# Patient Record
Sex: Female | Born: 1935 | Race: White | Hispanic: No | Marital: Married | Smoking: Never smoker
Health system: Southern US, Community
[De-identification: ages and names within clinical notes are randomized; demographics above are authoritative.]

## PROBLEM LIST (undated history)

## (undated) DIAGNOSIS — I639 Cerebral infarction, unspecified: Secondary | ICD-10-CM

## (undated) DIAGNOSIS — M1990 Unspecified osteoarthritis, unspecified site: Secondary | ICD-10-CM

## (undated) DIAGNOSIS — I10 Essential (primary) hypertension: Secondary | ICD-10-CM

## (undated) DIAGNOSIS — I38 Endocarditis, valve unspecified: Secondary | ICD-10-CM

## (undated) HISTORY — PX: JOINT REPLACEMENT: SHX530

## (undated) HISTORY — PX: FRACTURE SURGERY: SHX138

## (undated) HISTORY — PX: CARDIAC CATHETERIZATION: SHX172

---

## 2003-06-20 ENCOUNTER — Other Ambulatory Visit: Payer: Self-pay

## 2004-09-11 ENCOUNTER — Ambulatory Visit: Payer: Self-pay | Admitting: Internal Medicine

## 2005-10-01 ENCOUNTER — Ambulatory Visit: Payer: Self-pay | Admitting: Internal Medicine

## 2006-04-11 ENCOUNTER — Ambulatory Visit: Payer: Self-pay | Admitting: Unknown Physician Specialty

## 2006-12-04 ENCOUNTER — Ambulatory Visit: Payer: Self-pay | Admitting: Internal Medicine

## 2007-02-15 ENCOUNTER — Emergency Department: Payer: Self-pay | Admitting: Internal Medicine

## 2007-08-10 ENCOUNTER — Ambulatory Visit: Payer: Self-pay | Admitting: General Practice

## 2007-08-24 ENCOUNTER — Inpatient Hospital Stay: Payer: Self-pay | Admitting: General Practice

## 2008-01-06 ENCOUNTER — Ambulatory Visit: Payer: Self-pay | Admitting: Internal Medicine

## 2008-07-28 ENCOUNTER — Ambulatory Visit: Payer: Self-pay | Admitting: Ophthalmology

## 2008-07-28 ENCOUNTER — Ambulatory Visit: Payer: Self-pay | Admitting: Cardiology

## 2009-02-07 ENCOUNTER — Ambulatory Visit: Payer: Self-pay | Admitting: Internal Medicine

## 2010-02-23 ENCOUNTER — Ambulatory Visit: Payer: Self-pay | Admitting: Internal Medicine

## 2011-04-23 ENCOUNTER — Ambulatory Visit: Payer: Self-pay | Admitting: Internal Medicine

## 2012-04-23 ENCOUNTER — Ambulatory Visit: Payer: Self-pay | Admitting: Internal Medicine

## 2013-04-26 ENCOUNTER — Emergency Department: Payer: Self-pay | Admitting: Emergency Medicine

## 2013-04-26 LAB — CBC WITH DIFFERENTIAL/PLATELET
Basophil %: 0.1 %
Eosinophil %: 0 %
HGB: 13.5 g/dL (ref 12.0–16.0)
Lymphocyte #: 1.2 10*3/uL (ref 1.0–3.6)
Lymphocyte %: 11 %
Monocyte #: 0.6 x10 3/mm (ref 0.2–0.9)
Monocyte %: 5.6 %
Neutrophil #: 8.8 10*3/uL — ABNORMAL HIGH (ref 1.4–6.5)
Neutrophil %: 83.3 %
Platelet: 222 10*3/uL (ref 150–440)
RBC: 4.11 10*6/uL (ref 3.80–5.20)

## 2013-04-26 LAB — PROTIME-INR: INR: 1

## 2013-04-26 LAB — BASIC METABOLIC PANEL
Anion Gap: 5 — ABNORMAL LOW (ref 7–16)
BUN: 18 mg/dL (ref 7–18)
Calcium, Total: 8.9 mg/dL (ref 8.5–10.1)
EGFR (African American): 56 — ABNORMAL LOW
EGFR (Non-African Amer.): 48 — ABNORMAL LOW
Osmolality: 276 (ref 275–301)
Potassium: 3.5 mmol/L (ref 3.5–5.1)

## 2013-04-26 LAB — APTT: Activated PTT: 27.8 secs (ref 23.6–35.9)

## 2013-06-12 ENCOUNTER — Ambulatory Visit: Payer: Self-pay | Admitting: General Practice

## 2014-02-21 DIAGNOSIS — I2510 Atherosclerotic heart disease of native coronary artery without angina pectoris: Secondary | ICD-10-CM | POA: Diagnosis present

## 2014-02-21 DIAGNOSIS — I10 Essential (primary) hypertension: Secondary | ICD-10-CM | POA: Diagnosis present

## 2014-10-24 IMAGING — CR DG SHOULDER 3+V*L*
1 series · 2 of 2 positions shown · non-contrast
Comparison: 04/26/2013

CLINICAL DATA: Postreduction.

EXAM:
DG SHOULDER 3+VIEWS LEFT

[Series 1: grashey · 0.17mm/px · 2 of 2 slices shown]
[im 1/2]
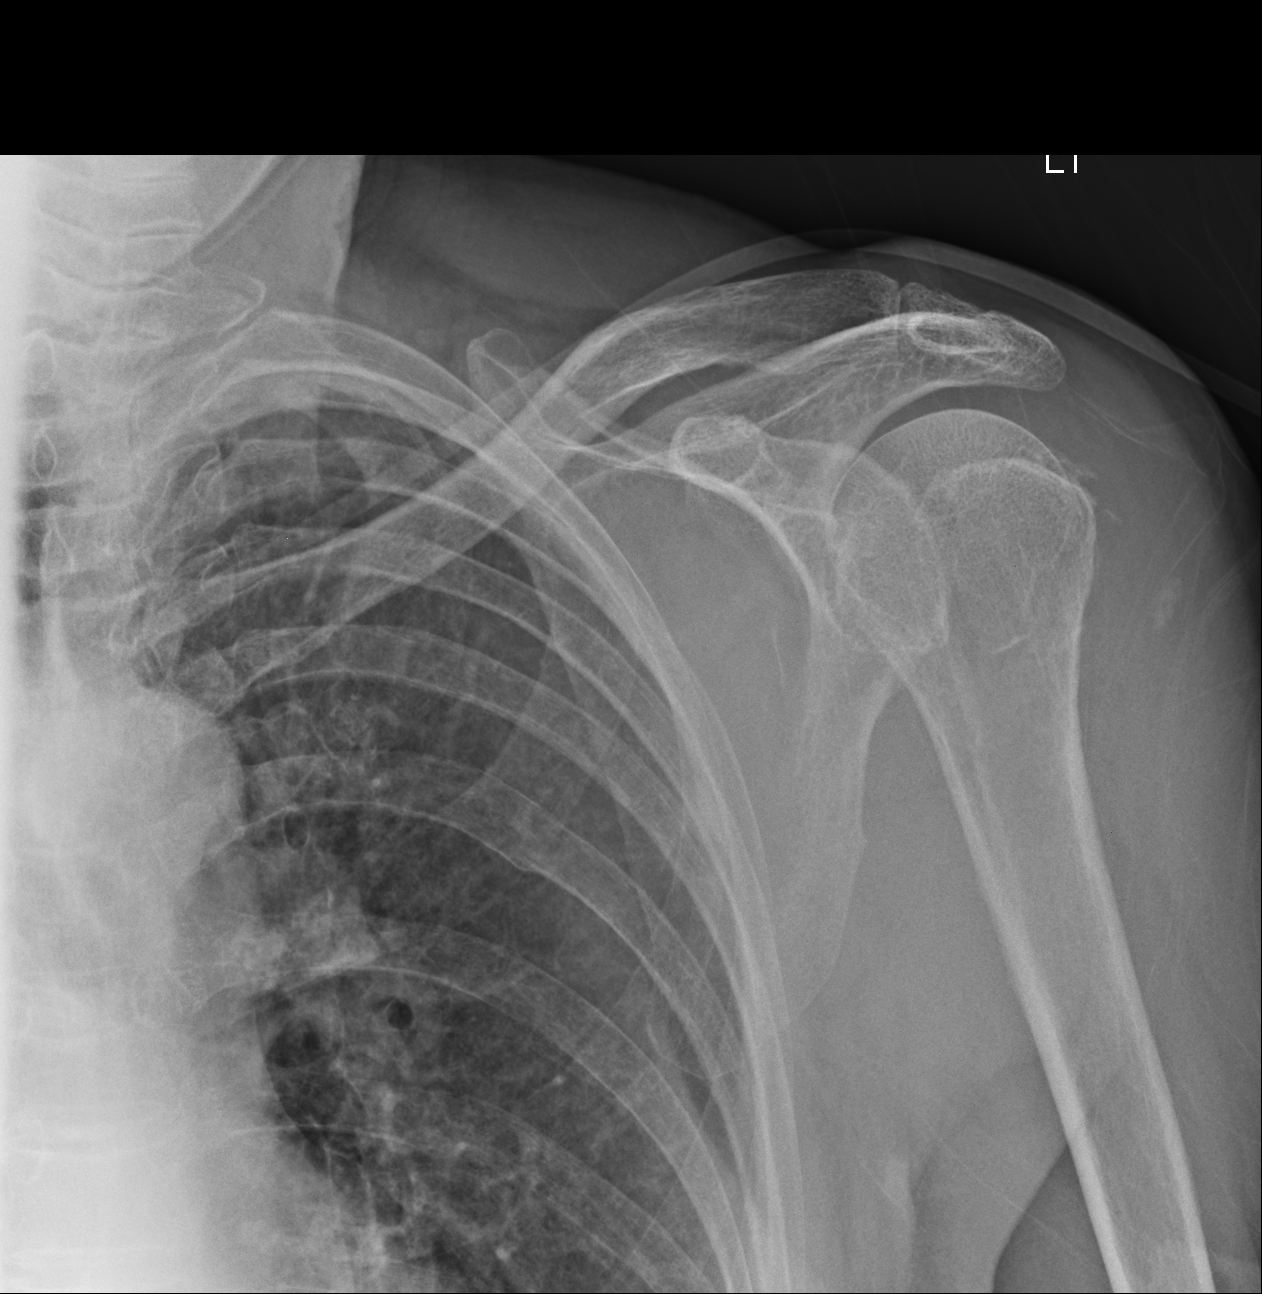
[im 2/2]
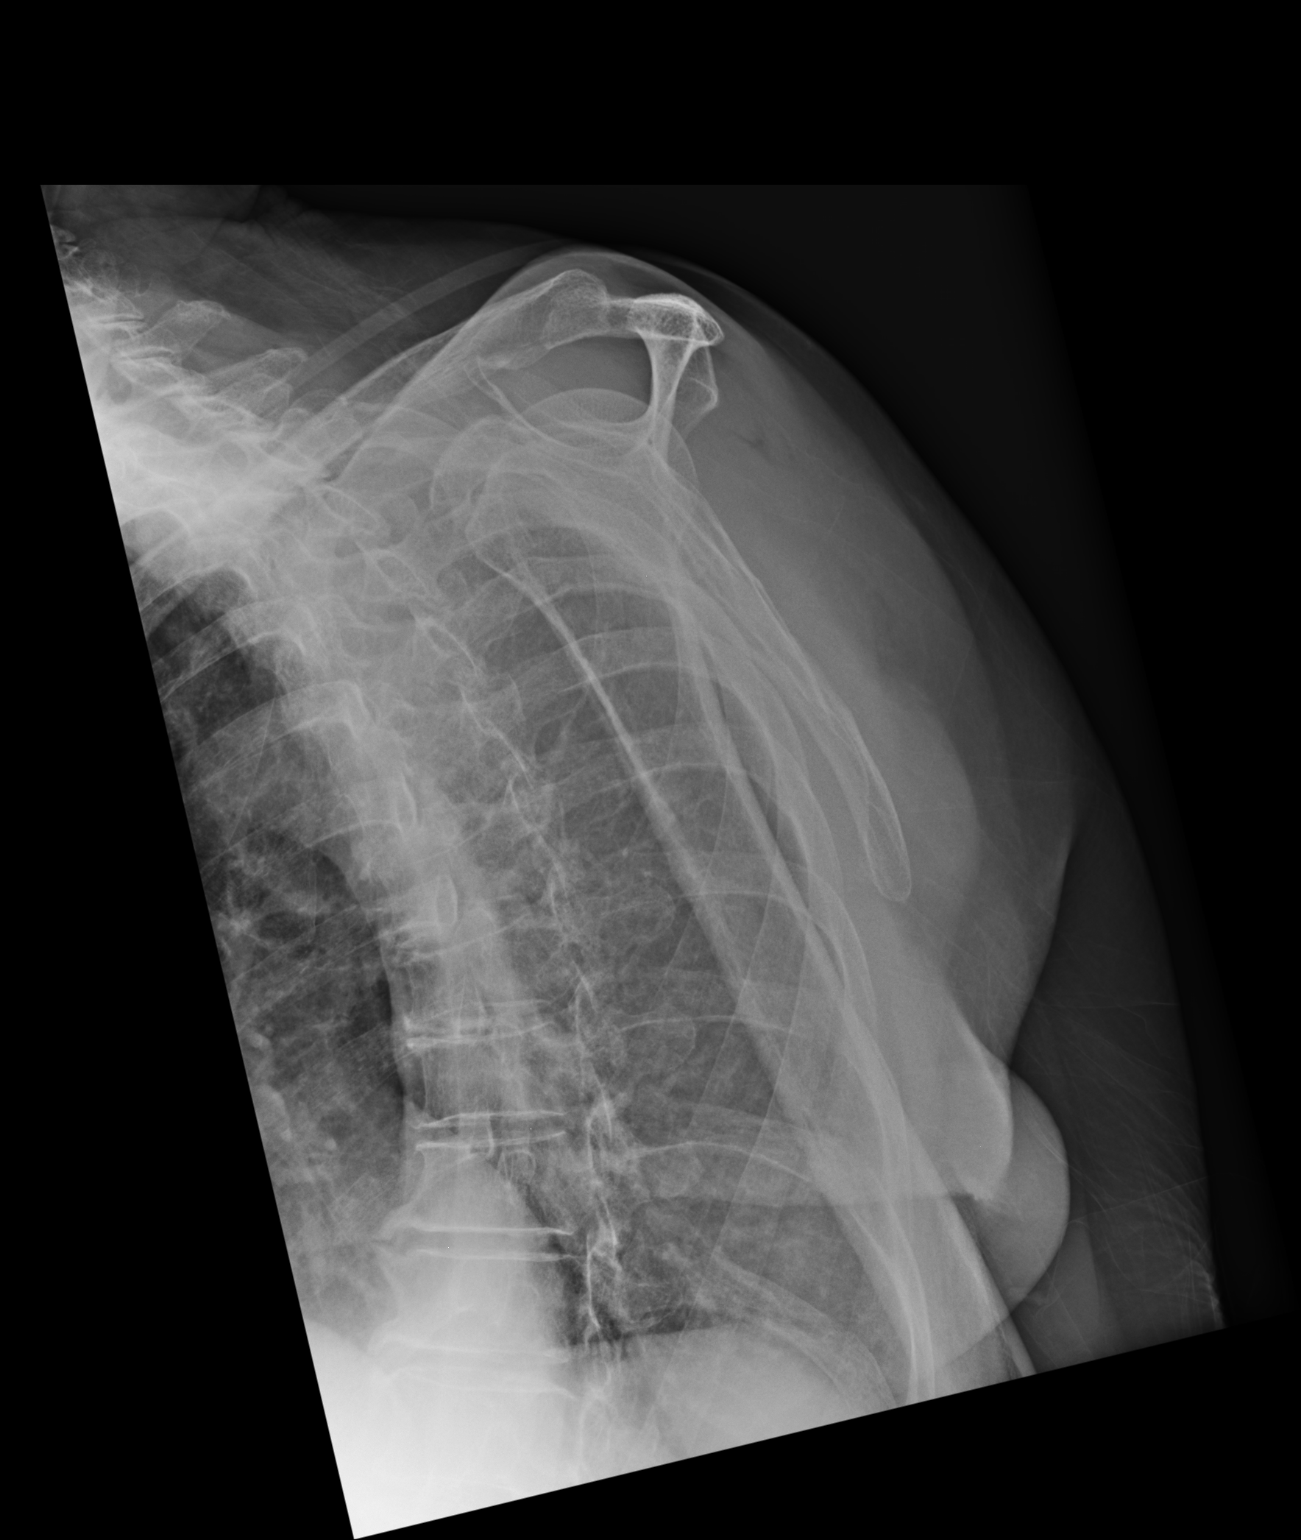

[2 of 2 positions shown; findings below may reference images not displayed]

FINDINGS: Interval reduction of the dislocated left shoulder. There appears to
be normal alignment currently. The large bone fragment at the
greater tuberosity not as well visualized on these repeat images.
IMPRESSION: Interval reduction of the dislocated left shoulder. Large bone
fragment not as well visualized.

## 2016-04-18 ENCOUNTER — Encounter: Payer: Self-pay | Admitting: *Deleted

## 2016-04-23 ENCOUNTER — Ambulatory Visit: Payer: Medicare Other | Admitting: Anesthesiology

## 2016-04-23 ENCOUNTER — Ambulatory Visit
Admission: RE | Admit: 2016-04-23 | Discharge: 2016-04-23 | Disposition: A | Discharge status: Home / Self Care | Payer: Medicare Other | Source: Ambulatory Visit | Attending: Ophthalmology | Admitting: Ophthalmology

## 2016-04-23 ENCOUNTER — Encounter
Admission: RE | Disposition: A | Discharge status: Home / Self Care | Payer: Self-pay | Source: Ambulatory Visit | Attending: Ophthalmology

## 2016-04-23 ENCOUNTER — Encounter: Payer: Self-pay | Admitting: Ophthalmology

## 2016-04-23 DIAGNOSIS — H2512 Age-related nuclear cataract, left eye: Secondary | ICD-10-CM | POA: Insufficient documentation

## 2016-04-23 HISTORY — DX: Unspecified osteoarthritis, unspecified site: M19.90

## 2016-04-23 HISTORY — DX: Endocarditis, valve unspecified: I38

## 2016-04-23 HISTORY — PX: CATARACT EXTRACTION W/PHACO: SHX586

## 2016-04-23 HISTORY — DX: Cerebral infarction, unspecified: I63.9

## 2016-04-23 HISTORY — DX: Essential (primary) hypertension: I10

## 2016-04-23 SURGERY — PHACOEMULSIFICATION, CATARACT, WITH IOL INSERTION
Anesthesia: Topical | Site: Eye | Laterality: Left | Wound class: Clean

## 2016-04-23 MED ORDER — POVIDONE-IODINE 5 % OP SOLN
OPHTHALMIC | Status: DC | PRN
  Administered 2016-04-23: 1 via OPHTHALMIC

## 2016-04-23 MED ORDER — ARMC OPHTHALMIC DILATING DROPS
OPHTHALMIC | Status: AC
  Administered 2016-04-23: 1 via OPHTHALMIC
  Filled 2016-04-23: qty 0.4

## 2016-04-23 MED ORDER — LIDOCAINE HCL (PF) 4 % IJ SOLN
INTRAMUSCULAR | Status: DC | PRN
  Administered 2016-04-23: 4 mL via OPHTHALMIC

## 2016-04-23 MED ORDER — NA CHONDROIT SULF-NA HYALURON 40-17 MG/ML IO SOLN
INTRAOCULAR | Status: AC
  Filled 2016-04-23: qty 1

## 2016-04-23 MED ORDER — EPINEPHRINE PF 1 MG/ML IJ SOLN
INTRAMUSCULAR | Status: AC
  Filled 2016-04-23: qty 2

## 2016-04-23 MED ORDER — MOXIFLOXACIN HCL 0.5 % OP SOLN
1.0000 [drp] | OPHTHALMIC | Status: AC
  Administered 2016-04-23 (×3): 1 [drp] via OPHTHALMIC

## 2016-04-23 MED ORDER — LIDOCAINE HCL (PF) 4 % IJ SOLN
INTRAMUSCULAR | Status: AC
  Filled 2016-04-23: qty 5

## 2016-04-23 MED ORDER — MOXIFLOXACIN HCL 0.5 % OP SOLN
OPHTHALMIC | Status: DC | PRN
  Administered 2016-04-23: 1 [drp] via OPHTHALMIC

## 2016-04-23 MED ORDER — POVIDONE-IODINE 5 % OP SOLN
OPHTHALMIC | Status: AC
Start: 2016-04-23 — End: 2016-04-23
  Filled 2016-04-23: qty 30

## 2016-04-23 MED ORDER — EPINEPHRINE PF 1 MG/ML IJ SOLN
INTRAMUSCULAR | Status: DC | PRN
  Administered 2016-04-23: 1 mL via OPHTHALMIC

## 2016-04-23 MED ORDER — CARBACHOL 0.01 % IO SOLN
INTRAOCULAR | Status: DC | PRN
  Administered 2016-04-23: 0.5 mL via INTRAOCULAR

## 2016-04-23 MED ORDER — MOXIFLOXACIN HCL 0.5 % OP SOLN
OPHTHALMIC | Status: AC
Start: 2016-04-23 — End: 2016-04-23
  Administered 2016-04-23: 1 [drp] via OPHTHALMIC
  Filled 2016-04-23: qty 3

## 2016-04-23 MED ORDER — NA CHONDROIT SULF-NA HYALURON 40-17 MG/ML IO SOLN
INTRAOCULAR | Status: DC | PRN
  Administered 2016-04-23: 1 mL via INTRAOCULAR

## 2016-04-23 MED ORDER — SODIUM CHLORIDE 0.9 % IV SOLN
INTRAVENOUS | Status: DC
  Administered 2016-04-23: 13:00:00 via INTRAVENOUS

## 2016-04-23 MED ORDER — MIDAZOLAM HCL 2 MG/2ML IJ SOLN
INTRAMUSCULAR | Status: DC | PRN
  Administered 2016-04-23: 1 mg via INTRAVENOUS

## 2016-04-23 MED ORDER — ARMC OPHTHALMIC DILATING DROPS
1.0000 "application " | OPHTHALMIC | Status: AC
  Administered 2016-04-23 (×3): 1 via OPHTHALMIC

## 2016-04-23 SURGICAL SUPPLY — 21 items
CANNULA ANT/CHMB 27GA (MISCELLANEOUS) ×3 IMPLANT
CUP MEDICINE 2OZ PLAST GRAD ST (MISCELLANEOUS) ×3 IMPLANT
GLOVE BIO SURGEON STRL SZ8 (GLOVE) ×3 IMPLANT
GLOVE BIOGEL M 6.5 STRL (GLOVE) ×3 IMPLANT
GLOVE SURG LX 8.0 MICRO (GLOVE) ×2
GLOVE SURG LX STRL 8.0 MICRO (GLOVE) ×1 IMPLANT
GOWN STRL REUS W/ TWL LRG LVL3 (GOWN DISPOSABLE) ×2 IMPLANT
GOWN STRL REUS W/TWL LRG LVL3 (GOWN DISPOSABLE) ×4
LENS IOL TECNIS ITEC 23.0 (Intraocular Lens) ×3 IMPLANT
PACK CATARACT (MISCELLANEOUS) ×3 IMPLANT
PACK CATARACT BRASINGTON LX (MISCELLANEOUS) ×3 IMPLANT
PACK EYE AFTER SURG (MISCELLANEOUS) ×3 IMPLANT
SOL BSS BAG (MISCELLANEOUS) ×3
SOL PREP PVP 2OZ (MISCELLANEOUS) ×3
SOLUTION BSS BAG (MISCELLANEOUS) ×1 IMPLANT
SOLUTION PREP PVP 2OZ (MISCELLANEOUS) ×1 IMPLANT
SYR 3ML LL SCALE MARK (SYRINGE) ×3 IMPLANT
SYR 5ML LL (SYRINGE) ×3 IMPLANT
SYR TB 1ML 27GX1/2 LL (SYRINGE) ×3 IMPLANT
WATER STERILE IRR 250ML POUR (IV SOLUTION) ×3 IMPLANT
WIPE NON LINTING 3.25X3.25 (MISCELLANEOUS) ×3 IMPLANT

## 2016-04-23 NOTE — Transfer of Care (Signed)
Immediate Anesthesia Transfer of Care Note  Patient: Caitlyn HamperFrances Burns  Procedure(s) Performed: Procedure(s) with comments: CATARACT EXTRACTION PHACO AND INTRAOCULAR LENS PLACEMENT (IOC) (Left) - US 59.9 AP% 20.9 CDE 12.48 FLUID PACK LOT # [id number]  Patient Location: PACU and Short Stay  Anesthesia Type:MAC  Level of Consciousness: awake, alert  and oriented  Airway & Oxygen Therapy: Patient Spontanous Breathing  Post-op Assessment: Report given to RN and Post -op Vital signs reviewed and stable  Post vital signs: Reviewed and stable  Last Vitals:  Vitals:   04/23/16 1210 04/23/16 1351  BP: (!) 135/51 (!) 120/43  Pulse: 63 (!) 59  Resp:  16  Temp: 36.5 C (!) 36 C    Last Pain:  Vitals:   04/23/16 1351  TempSrc: Oral         Complications: No apparent anesthesia complications

## 2016-04-23 NOTE — Op Note (Signed)
PREOPERATIVE DIAGNOSIS:  Nuclear sclerotic cataract of the left eye.   POSTOPERATIVE DIAGNOSIS:  Nuclear sclerotic cataract of the left eye.   OPERATIVE PROCEDURE: Procedure(s): CATARACT EXTRACTION PHACO AND INTRAOCULAR LENS PLACEMENT (IOC)   SURGEON:  Galen ManilaWilliam Rafaelita Foister, MD.   ANESTHESIA:  Anesthesiologist: Yevette EdwardsJames G Adams, MD CRNA: Omer JackJanice Weatherly, CRNA  1.      Managed anesthesia care. 2.     0.131ml of Shugarcaine was instilled following the paracentesis   COMPLICATIONS:  None.   TECHNIQUE:   Stop and chop   DESCRIPTION OF PROCEDURE:  The patient was examined and consented in the preoperative holding area where the aforementioned topical anesthesia was applied to the left eye and then brought back to the Operating Room where the left eye was prepped and draped in the usual sterile ophthalmic fashion and a lid speculum was placed. A paracentesis was created with the side port blade and the anterior chamber was filled with viscoelastic. A near clear corneal incision was performed with the steel keratome. A continuous curvilinear capsulorrhexis was performed with a cystotome followed by the capsulorrhexis forceps. Hydrodissection and hydrodelineation were carried out with BSS on a blunt cannula. The lens was removed in a stop and chop  technique and the remaining cortical material was removed with the irrigation-aspiration handpiece. The capsular bag was inflated with viscoelastic and the Technis [id number] lens was placed in the capsular bag without complication. The remaining viscoelastic was removed from the eye with the irrigation-aspiration handpiece. The wounds were hydrated. The anterior chamber was flushed with Miostat and the eye was inflated to physiologic pressure. 0.691ml Vigamox was placed in the anterior chamber. The wounds were found to be water tight. The eye was dressed with Vigamox. The patient was given protective glasses to wear throughout the day and a shield with which to sleep  tonight. The patient was also given drops with which to begin a drop regimen today and will follow-up with me in one day.  Implant Name Type Inv. Item Serial No. Manufacturer Lot No. LRB No. Used  LENS IOL DIOP 23.0 - [id number] Intraocular Lens LENS IOL DIOP 23.0 [id number] AMO   Left 1    Procedure(s) with comments: CATARACT EXTRACTION PHACO AND INTRAOCULAR LENS PLACEMENT (IOC) (Left) - US 59.9 AP% 20.9 CDE 12.48 FLUID PACK LOT # [id number] H  Electronically signed: Mitchell Iwanicki LOUIS 04/23/2016 1:47 PM

## 2016-04-23 NOTE — Anesthesia Postprocedure Evaluation (Signed)
Anesthesia Post Note  Patient: Caitlyn Burns  Procedure(s) Performed: Procedure(s) (LRB): CATARACT EXTRACTION PHACO AND INTRAOCULAR LENS PLACEMENT (IOC) (Left)  Patient location during evaluation: Short Stay Level of consciousness: awake and alert and oriented Pain management: pain level controlled Vital Signs Assessment: post-procedure vital signs reviewed and stable Respiratory status: spontaneous breathing Cardiovascular status: stable Postop Assessment: no headache Anesthetic complications: no    Last Vitals:  Vitals:   04/23/16 1210 04/23/16 1351  BP: (!) 135/51 (!) 120/43  Pulse: 63 (!) 59  Resp:  16  Temp: 36.5 C (!) 36 C    Last Pain:  Vitals:   04/23/16 1351  TempSrc: Oral                 Caitlyn Burns,  Caitlyn Burns

## 2016-04-23 NOTE — Discharge Instructions (Signed)
Eye Surgery Discharge Instructions  Expect mild scratchy sensation or mild soreness. DO NOT RUB YOUR EYE!  The day of surgery:  Minimal physical activity, but bed rest is not required  No reading, computer work, or close hand work  No bending, lifting, or straining.  May watch TV  For 24 hours:  No driving, legal decisions, or alcoholic beverages  Safety precautions  Eat anything you prefer: It is better to start with liquids, then soup then solid foods.  _____ Eye patch should be worn until postoperative exam tomorrow.  ____ Solar shield eyeglasses should be worn for comfort in the sunlight/patch while sleeping  Resume all regular medications including aspirin or Coumadin if these were discontinued prior to surgery. You may shower, bathe, shave, or wash your hair. Tylenol may be taken for mild discomfort.  Call your doctor if you experience significant pain, nausea, or vomiting, fever > 101 or other signs of infection. [phone] or [phone] Specific instructions:  Follow-up Information    PORFILIO,WILLIAM LOUIS, MD Follow up on 04/24/2016.   Specialty:  Ophthalmology Why:  1:10 Contact information: [address] [place] [address] [phone]

## 2016-04-23 NOTE — H&P (Signed)
All labs reviewed. Abnormal studies sent to patients PCP when indicated.  Previous H&P reviewed, patient examined, there are NO CHANGES.  Caitlyn Burns LOUIS11/14/20171:19 PM

## 2016-04-25 NOTE — Anesthesia Preprocedure Evaluation (Signed)
Anesthesia Evaluation  Patient identified by MRN, date of birth, ID band Patient awake    Reviewed: Allergy & Precautions, H&P , NPO status , Patient's Chart, lab work & pertinent test results, reviewed documented beta blocker date and time   Airway Mallampati: II  TM Distance: >3 FB Neck ROM: full    Dental no notable dental hx. (+) Teeth Intact   Pulmonary neg pulmonary ROS,    Pulmonary exam normal breath sounds clear to auscultation       Cardiovascular Exercise Tolerance: Good hypertension, negative cardio ROS   Rhythm:regular Rate:Normal     Neuro/Psych CVA negative neurological ROS  negative psych ROS   GI/Hepatic negative GI ROS, Neg liver ROS,   Endo/Other  negative endocrine ROSdiabetes  Renal/GU      Musculoskeletal   Abdominal   Peds  Hematology negative hematology ROS (+)   Anesthesia Other Findings   Reproductive/Obstetrics negative OB ROS                             Anesthesia Physical Anesthesia Plan  ASA: III  Anesthesia Plan: MAC   Post-op Pain Management:    Induction:   Airway Management Planned:   Additional Equipment:   Intra-op Plan:   Post-operative Plan:   Informed Consent: I have reviewed the patients History and Physical, chart, labs and discussed the procedure including the risks, benefits and alternatives for the proposed anesthesia with the patient or authorized representative who has indicated his/her understanding and acceptance.     Plan Discussed with: CRNA  Anesthesia Plan Comments:         Anesthesia Quick Evaluation

## 2019-12-16 DIAGNOSIS — N1831 Chronic kidney disease, stage 3a: Secondary | ICD-10-CM | POA: Insufficient documentation

## 2020-12-19 ENCOUNTER — Other Ambulatory Visit: Payer: Self-pay | Admitting: Internal Medicine

## 2020-12-19 DIAGNOSIS — R413 Other amnesia: Secondary | ICD-10-CM

## 2020-12-20 ENCOUNTER — Other Ambulatory Visit: Payer: Self-pay

## 2020-12-20 ENCOUNTER — Ambulatory Visit
Admission: RE | Admit: 2020-12-20 | Discharge: 2020-12-20 | Disposition: A | Discharge status: Home / Self Care | Payer: Medicare Other | Source: Ambulatory Visit | Attending: Internal Medicine | Admitting: Internal Medicine

## 2020-12-20 DIAGNOSIS — R413 Other amnesia: Secondary | ICD-10-CM

## 2021-03-12 ENCOUNTER — Other Ambulatory Visit: Payer: Self-pay | Admitting: Neurology

## 2021-03-12 ENCOUNTER — Other Ambulatory Visit (HOSPITAL_COMMUNITY): Payer: Self-pay | Admitting: Neurology

## 2021-03-12 DIAGNOSIS — R413 Other amnesia: Secondary | ICD-10-CM

## 2021-03-21 ENCOUNTER — Ambulatory Visit: Admission: RE | Admit: 2021-03-21 | Payer: Medicare Other | Source: Ambulatory Visit

## 2021-04-03 ENCOUNTER — Ambulatory Visit
Admission: RE | Admit: 2021-04-03 | Discharge: 2021-04-03 | Disposition: A | Discharge status: Home / Self Care | Payer: Medicare Other | Source: Ambulatory Visit | Attending: Neurology | Admitting: Neurology

## 2021-04-03 ENCOUNTER — Other Ambulatory Visit: Payer: Self-pay

## 2021-04-03 DIAGNOSIS — R413 Other amnesia: Secondary | ICD-10-CM | POA: Diagnosis not present

## 2021-06-21 DIAGNOSIS — F03B0 Unspecified dementia, moderate, without behavioral disturbance, psychotic disturbance, mood disturbance, and anxiety: Secondary | ICD-10-CM | POA: Diagnosis present

## 2021-06-21 DIAGNOSIS — F03C0 Unspecified dementia, severe, without behavioral disturbance, psychotic disturbance, mood disturbance, and anxiety: Secondary | ICD-10-CM | POA: Diagnosis present

## 2021-06-21 DIAGNOSIS — F0390 Unspecified dementia without behavioral disturbance: Secondary | ICD-10-CM | POA: Diagnosis present

## 2022-10-01 IMAGING — MR MR HEAD W/O CM
12 series · 43 of 48 positions shown · non-contrast
Comparison: Head CT 12/20/2020. Report from brain MRI 07/05/2003
(images unavailable).

CLINICAL DATA: Memory loss. Additional history provided by scanning
technologist: Memory loss and forgetfulness for 2-3 months.

EXAM:
MRI HEAD WITHOUT CONTRAST
TECHNIQUE: Multiplanar, multiecho pulse sequences of the brain and surrounding
structures were obtained without intravenous contrast.

[Series 5: ax dwi_tracew · axial · 3.0mm · 0.65mm/px · z∈[-111,+47]mm · 3 of 50 slices shown]
[im 1/50]
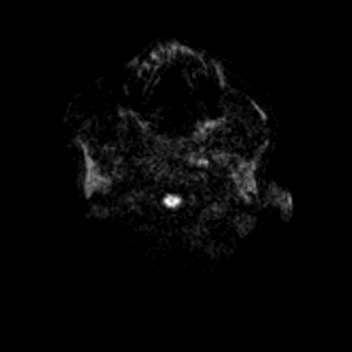
[im 25/50]
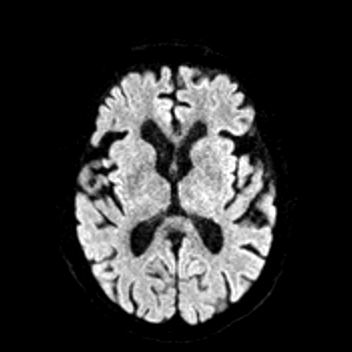
[im 50/50]
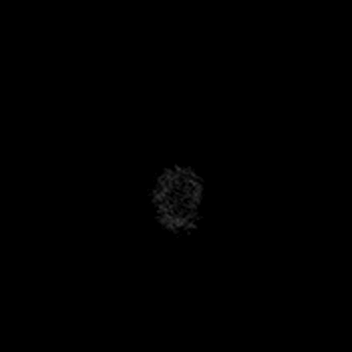

[Series 6: ax dwi_adc · axial · 3.0mm · 0.65mm/px · z∈[-111,+47]mm · 4 of 50 slices shown]
[im 1/50]
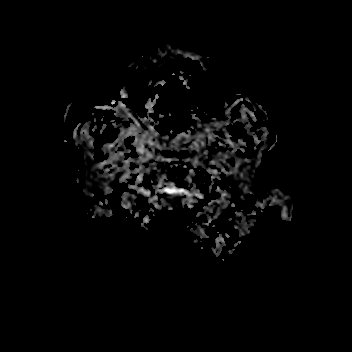
[im 17/50]
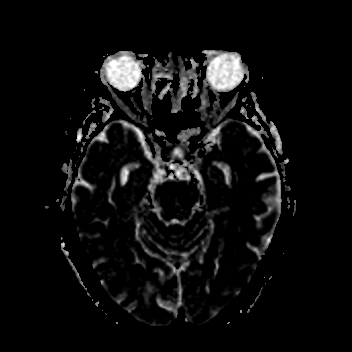
[im 33/50]
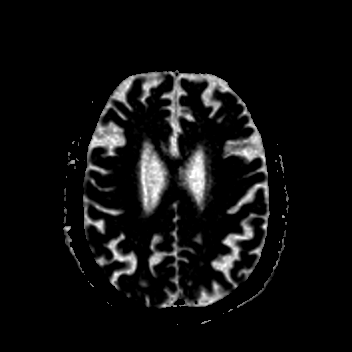
[im 50/50]
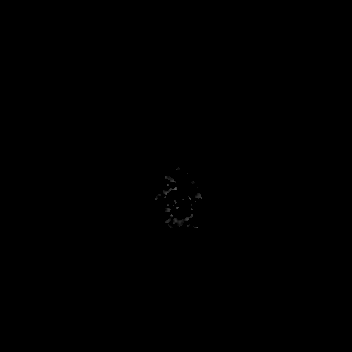

[Series 7: cor dwi_tracew · coronal · 5.0mm · 0.60mm/px · 3 of 40 slices shown]
[im 1/40]
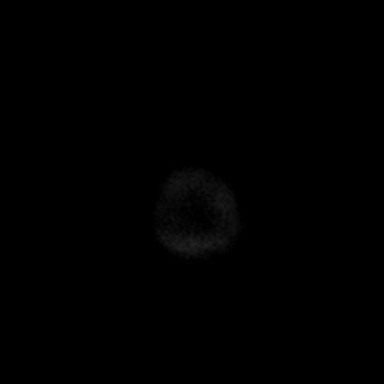
[im 20/40]
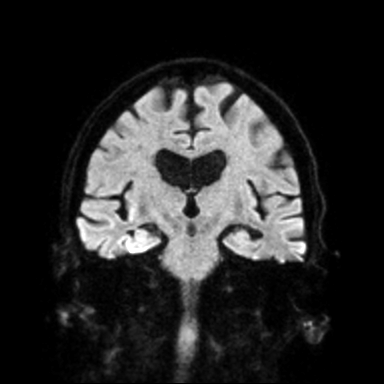
[im 40/40]
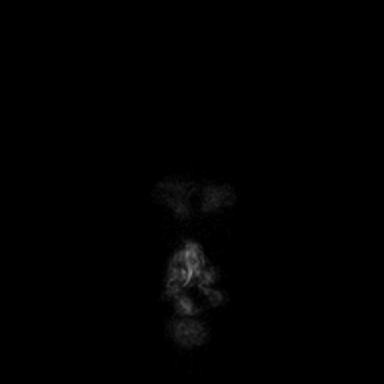

[Series 8: cor dwi_adc · coronal · 5.0mm · 0.60mm/px · 3 of 39 slices shown]
[im 1/39]
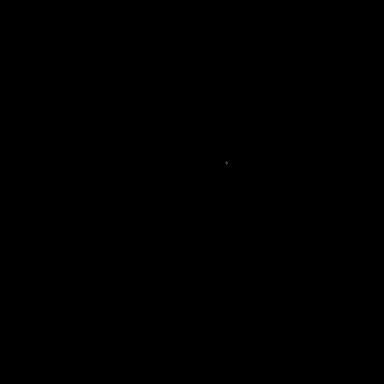
[im 20/39]
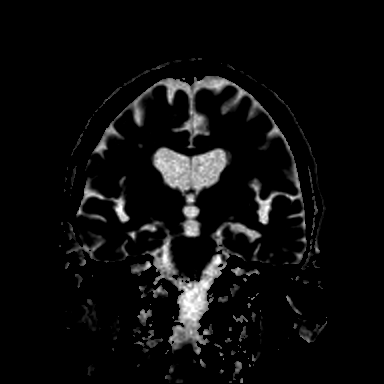
[im 39/39]
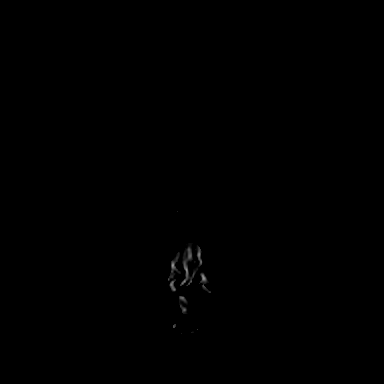

[Series 9: T1 · sagittal · 5.0mm · 0.62mm/px · 2 of 23 slices shown (1 of 2)]
[im 1/23]
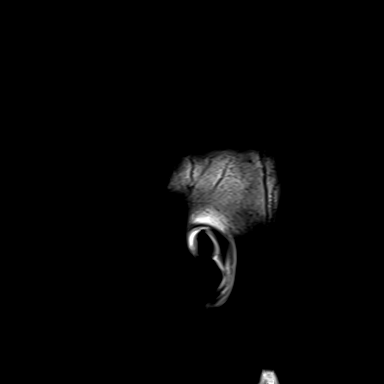
[im 23/23]
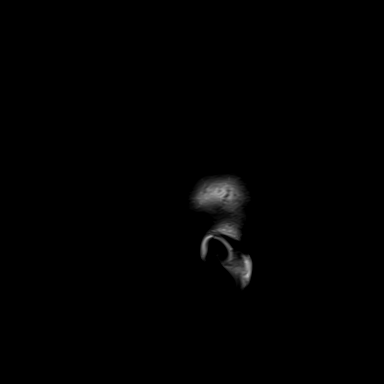

[Series 10: T2 · axial · 5.0mm · 0.53mm/px · z∈[-113,+46]mm · 2 of 28 slices shown (1 of 2)]
[im 1/28]
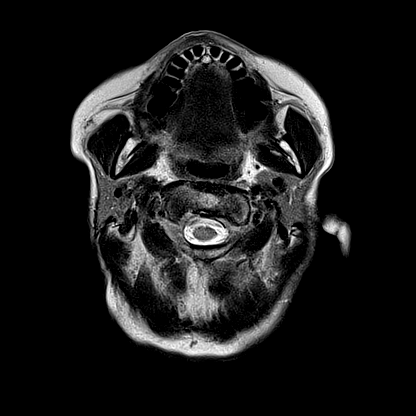
[im 28/28]
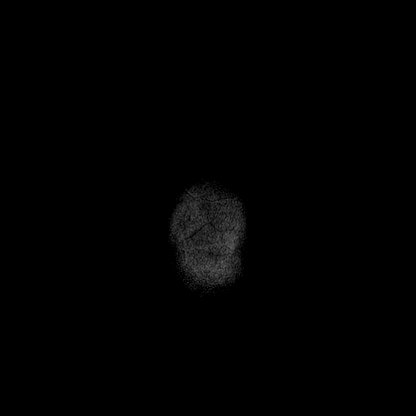

[Series 11: mag_images · axial · 3.0mm · 0.90mm/px · z∈[-119,+55]mm · 4 of 60 slices shown]
[im 1/60]
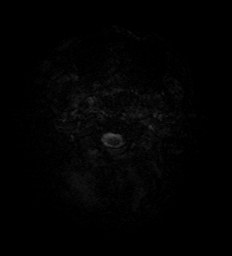
[im 20/60]
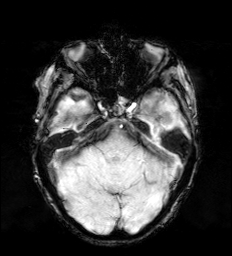
[im 40/60]
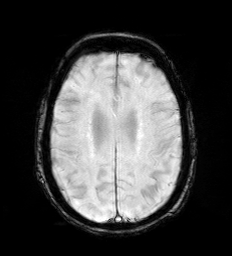
[im 60/60]
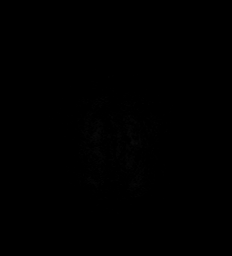

[Series 12: pha_images · axial · 3.0mm · 0.90mm/px · z∈[-119,+55]mm · 4 of 60 slices shown]
[im 1/60]
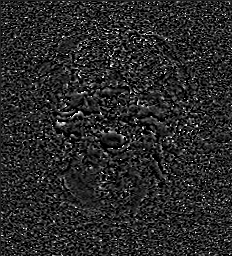
[im 20/60]
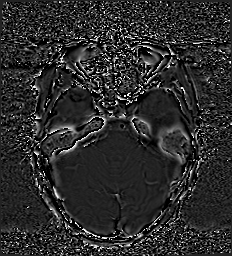
[im 40/60]
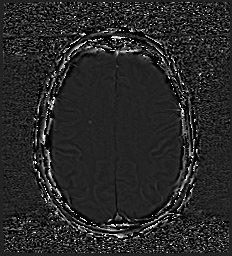
[im 60/60]
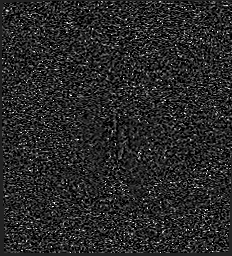

[Series 13: swi_images · axial · 3.0mm · 0.90mm/px · z∈[-119,+55]mm · 4 of 60 slices shown]
[im 1/60]
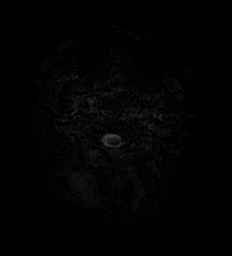
[im 20/60]
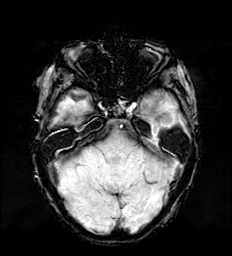
[im 40/60]
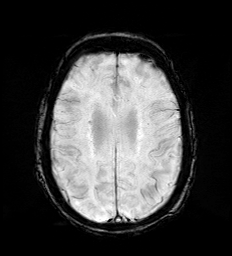
[im 60/60]
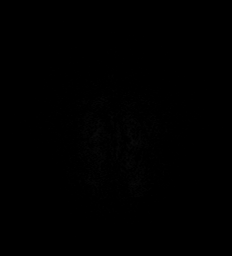

[Series 15: FLAIR · axial · 3.0mm · 0.53mm/px · z∈[-113,+46]mm · 4 of 55 slices shown]
[im 1/55]
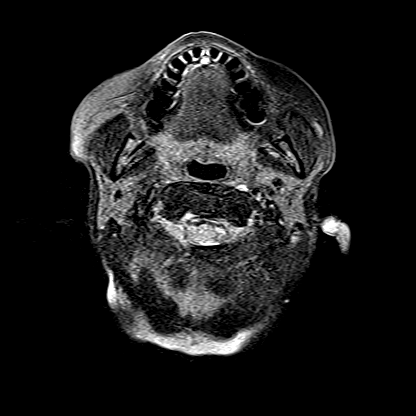
[im 19/55]
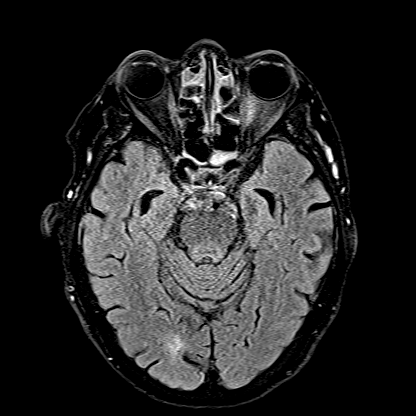
[im 37/55]
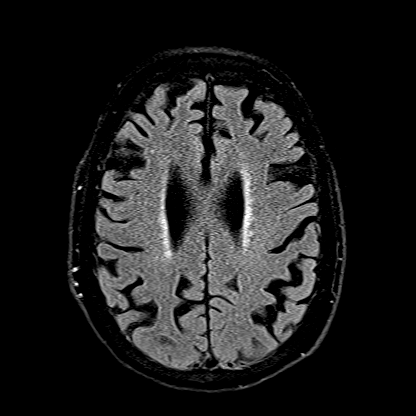
[im 55/55]
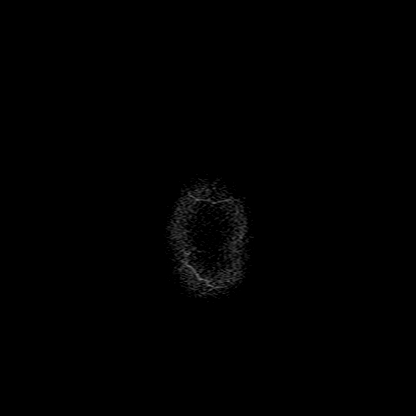

[Series 16: T1 · axial · 1.0mm · 0.98mm/px · z∈[-117,+54]mm · 8 of 176 slices shown (2 of 2)]
[im 1/176]
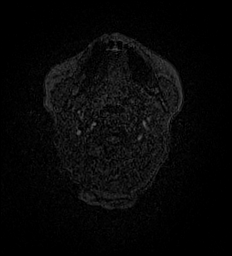
[im 30/176]
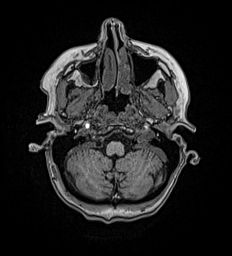
[im 59/176]
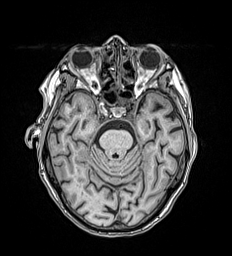
[im 73/176]
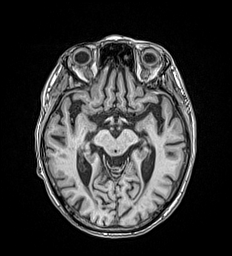
[im 103/176]
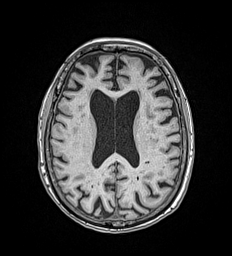
[im 117/176]
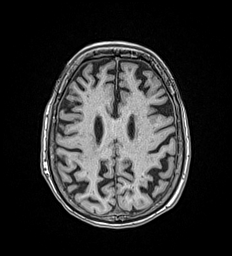
[im 146/176]
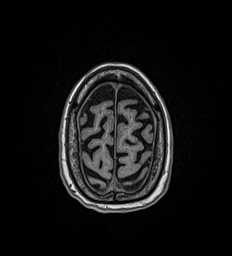
[im 176/176]
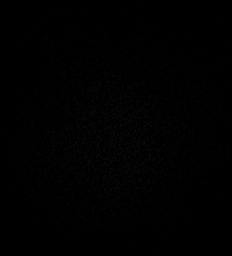

[Series 17: T2 · coronal · 5.0mm · 0.57mm/px · 2 of 29 slices shown (2 of 2)]
[im 1/29]
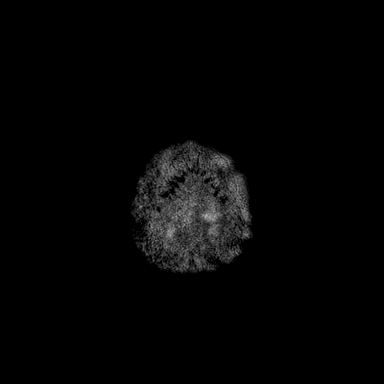
[im 29/29]
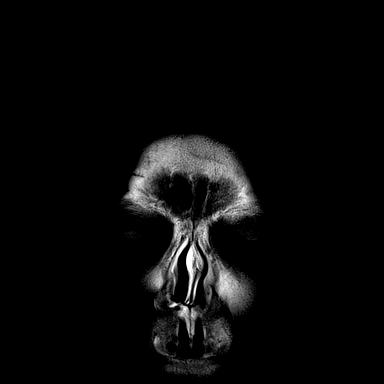

[43 of 48 positions shown; findings below may reference images not displayed]

FINDINGS: Brain:

Moderate generalized cerebral atrophy. Comparatively mild cerebellar
atrophy.

Chronic lacunar infarct within the right centrum semiovale/corona
radiata.

Small foci of T2 hyperintensity within the periatrial white matter
bilaterally, which may reflect a perivascular spaces and/or chronic
lacunar infarcts.

Background mild for age multifocal T2 FLAIR hyperintense signal
abnormality within the cerebral white matter, nonspecific but
compatible chronic small vessel ischemic disease.

Chronic microhemorrhages within the right frontal lobe
periventricular white matter and within the left thalamus.

There is no acute infarct.

No evidence of an intracranial mass.

No extra-axial fluid collection.

No midline shift.

Vascular: Maintained flow voids within the proximal large arterial
vessels.

Skull and upper cervical spine: No focal suspicious marrow lesion.

Sinuses/Orbits: Visualized orbits show no acute finding.
Mild-to-moderate partial opacification of the bilateral ethmoid air
cells secondary to the presence of mucosal thickening and fluid.
Fluid levels and background mild mucosal thickening within the
bilateral sphenoid sinuses. Mild mucosal thickening within the
bilateral maxillary sinuses. Superimposed small-volume frothy
secretions within the right maxillary sinus.

Other: Trace fluid within the bilateral mastoid air cells.
IMPRESSION: No evidence of acute intracranial abnormality.

Chronic lacunar infarct within the right centrum semiovale/corona
radiata.

Prominent perivascular spaces and/or chronic lacunar infarcts within
the bilateral periatrial white matter.

Background mild-for-age cerebral white matter chronic small vessel
ischemic disease.

Moderate generalized cerebral atrophy. Comparatively mild cerebellar
atrophy.

Paranasal sinus disease, as described. Correlate for acute
sinusitis.

Trace fluid within the bilateral mastoid air cells.

## 2023-01-25 ENCOUNTER — Emergency Department: Payer: Medicare Other

## 2023-01-25 ENCOUNTER — Emergency Department
Admission: EM | Admit: 2023-01-25 | Discharge: 2023-01-25 | Disposition: A | Discharge status: Home / Self Care | Payer: Medicare Other | Source: Home / Self Care | Attending: Emergency Medicine | Admitting: Emergency Medicine

## 2023-01-25 ENCOUNTER — Other Ambulatory Visit: Payer: Self-pay

## 2023-01-25 DIAGNOSIS — R410 Disorientation, unspecified: Secondary | ICD-10-CM | POA: Insufficient documentation

## 2023-01-25 DIAGNOSIS — I10 Essential (primary) hypertension: Secondary | ICD-10-CM | POA: Insufficient documentation

## 2023-01-25 DIAGNOSIS — W19XXXA Unspecified fall, initial encounter: Secondary | ICD-10-CM | POA: Insufficient documentation

## 2023-01-25 DIAGNOSIS — R296 Repeated falls: Secondary | ICD-10-CM | POA: Diagnosis not present

## 2023-01-25 DIAGNOSIS — F0390 Unspecified dementia without behavioral disturbance: Secondary | ICD-10-CM | POA: Insufficient documentation

## 2023-01-25 DIAGNOSIS — S7002XA Contusion of left hip, initial encounter: Secondary | ICD-10-CM | POA: Insufficient documentation

## 2023-01-25 DIAGNOSIS — S32592A Other specified fracture of left pubis, initial encounter for closed fracture: Secondary | ICD-10-CM | POA: Diagnosis not present

## 2023-01-25 LAB — URINALYSIS, W/ REFLEX TO CULTURE (INFECTION SUSPECTED)
Bacteria, UA: NONE SEEN
Bilirubin Urine: NEGATIVE
Glucose, UA: NEGATIVE mg/dL
Hgb urine dipstick: NEGATIVE
Ketones, ur: 5 mg/dL — AB
Leukocytes,Ua: NEGATIVE
Nitrite: NEGATIVE
Protein, ur: NEGATIVE mg/dL
Specific Gravity, Urine: 1.026 (ref 1.005–1.030)
pH: 5 (ref 5.0–8.0)

## 2023-01-25 LAB — BASIC METABOLIC PANEL
Anion gap: 5 (ref 5–15)
BUN: 14 mg/dL (ref 8–23)
CO2: 24 mmol/L (ref 22–32)
Calcium: 8.5 mg/dL — ABNORMAL LOW (ref 8.9–10.3)
Chloride: 109 mmol/L (ref 98–111)
Creatinine, Ser: 0.85 mg/dL (ref 0.44–1.00)
GFR, Estimated: >60 mL/min (ref >60)
Glucose, Bld: 93 mg/dL (ref 70–99)
Potassium: 4.2 mmol/L (ref 3.5–5.1)
Sodium: 138 mmol/L (ref 135–145)

## 2023-01-25 LAB — CBC WITH DIFFERENTIAL/PLATELET
Abs Immature Granulocytes: 0.02 10*3/uL (ref 0.00–0.07)
Basophils Absolute: 0 10*3/uL (ref 0.0–0.1)
Basophils Relative: 0 %
Eosinophils Absolute: 0 10*3/uL (ref 0.0–0.5)
Eosinophils Relative: 0 %
HCT: 37.1 % (ref 36.0–46.0)
Hemoglobin: 12.2 g/dL (ref 12.0–15.0)
Immature Granulocytes: 0 %
Lymphocytes Relative: 17 %
Lymphs Abs: 1.1 10*3/uL (ref 0.7–4.0)
MCH: 31.9 pg (ref 26.0–34.0)
MCHC: 32.9 g/dL (ref 30.0–36.0)
MCV: 96.9 fL (ref 80.0–100.0)
Monocytes Absolute: 0.7 10*3/uL (ref 0.1–1.0)
Monocytes Relative: 10 %
Neutro Abs: 4.8 10*3/uL (ref 1.7–7.7)
Neutrophils Relative %: 73 %
Platelets: 172 10*3/uL (ref 150–400)
RBC: 3.83 MIL/uL — ABNORMAL LOW (ref 3.87–5.11)
RDW: 13.1 % (ref 11.5–15.5)
WBC: 6.6 10*3/uL (ref 4.0–10.5)
nRBC: 0 % (ref 0.0–0.2)

## 2023-01-25 MED ORDER — DICLOFENAC SODIUM 1 % EX GEL: 2.0000 g | Freq: Four times a day (QID) | CUTANEOUS | 0 refills | Status: DC

## 2023-01-25 NOTE — ED Provider Notes (Signed)
[facility] Provider Note    Event Date/Time   First MD Initiated Contact with Patient 01/25/23 1114     (approximate)   History   Chief Complaint: Fall   HPI  Caitlyn Burns is a 89 y.o. female with a history of hypertension, stroke, dementia who was brought to the ED due to a fall 2 days ago.  She is complained of left hip pain since then.  Did not hit her head.  Otherwise been in her baseline state of health, no weakness or vomiting or diarrhea.  She does seem more confused over the last 2 days since the fall.     Physical Exam   Triage Vital Signs: ED Triage Vitals [01/25/23 1054]  Encounter Vitals Group     BP 124/61     Systolic BP Percentile      Diastolic BP Percentile      Pulse Rate 66     Resp 18     Temp 98.1 F (36.7 C)     Temp Source Oral     SpO2 98 %     Weight 109 lb (49.4 kg)     Height 5\' 2"  (1.575 m)     Head Circumference      Peak Flow      Pain Score 5     Pain Loc      Pain Education      Exclude from Growth Chart     Most recent vital signs: Vitals:   01/25/23 1054  BP: 124/61  Pulse: 66  Resp: 18  Temp: 98.1 F (36.7 C)  SpO2: 98%    General: Awake, no distress.  CV:  Good peripheral perfusion.  Regular rate and rhythm Resp:  Normal effort.  Clear to auscultation bilaterally Abd:  No distention.  Soft nontender Other:  Mild tenderness at the left hip.  Intact range of motion.  No laceration.  No signs of head trauma.  No spinal tenderness.   ED Results / Procedures / Treatments   Labs (all labs ordered are listed, but only abnormal results are displayed) Labs Reviewed  URINALYSIS, W/ REFLEX TO CULTURE (INFECTION SUSPECTED) - Abnormal; Notable for the following components:      Result Value   Color, Urine YELLOW (*)    APPearance HAZY (*)    Ketones, ur 5 (*)    All other components within normal limits  BASIC METABOLIC PANEL - Abnormal; Notable for the following components:   Calcium 8.5 (*)     All other components within normal limits  CBC WITH DIFFERENTIAL/PLATELET - Abnormal; Notable for the following components:   RBC 3.83 (*)    All other components within normal limits     EKG    RADIOLOGY CT head interpreted by me, negative for intracranial hemorrhage.  Radiology report reviewed.  X-ray left hip negative   PROCEDURES:  Procedures   MEDICATIONS ORDERED IN ED: Medications - No data to display   IMPRESSION / MDM / ASSESSMENT AND PLAN / ED COURSE  I reviewed the triage vital signs and the nursing notes.  DDx: Intracranial hemorrhage, left hip fracture, anemia, electrolyte abnormality, dehydration, AKI, UTI, mechanical fall  Patient's presentation is most consistent with acute presentation with potential threat to life or bodily function.  Patient brought to the ED due to a fall at home.  By history from patient's husband, this was mechanical and not syncope.  No acute specific symptoms, patient denies any pain.  She  is able to ambulate with assistance to the bathroom.  Labs unremarkable, imaging reassuring.  Vital signs normal.  Awaiting UA  ----------------------------------------- 2:35 PM on 01/25/2023 ----------------------------------------- Urinalysis unremarkable.  Discussed with patient and family, comfortable with discharge home.  Will treat left hip soft tissue contusion with Tylenol and Voltaren gel, PCP follow-up recommended.       FINAL CLINICAL IMPRESSION(S) / ED DIAGNOSES   Final diagnoses:  Contusion of left hip, initial encounter     Rx / DC Orders   ED Discharge Orders          Ordered    diclofenac Sodium (VOLTAREN) 1 % GEL  4 times daily        01/25/23 1435             Note:  This document was prepared using Dragon voice recognition software and may include unintentional dictation errors.   Sharman Cheek, MD 01/25/23 1435

## 2023-01-25 NOTE — ED Triage Notes (Addendum)
Pt presents to ED with c/o of fall 2 days ago. No LOC noted. Pt has HX of dementia- husband states no change in pt's baseline. Pt on Plavix but pt and husband deny pt hitting her head.

## 2023-01-25 NOTE — ED Notes (Signed)
After pt was leaving triage daughter stopped this RN to let her know she is more confused than normal. NAD noted.

## 2023-01-27 ENCOUNTER — Encounter: Payer: Self-pay | Admitting: Emergency Medicine

## 2023-01-27 ENCOUNTER — Emergency Department: Payer: Medicare Other

## 2023-01-27 ENCOUNTER — Inpatient Hospital Stay
Admission: EM | Admit: 2023-01-27 | Discharge: 2023-01-31 | DRG: 536 | Disposition: A | Discharge status: Skilled Nursing Facility | Payer: Medicare Other | Attending: Osteopathic Medicine | Admitting: Osteopathic Medicine

## 2023-01-27 ENCOUNTER — Other Ambulatory Visit: Payer: Self-pay

## 2023-01-27 DIAGNOSIS — R296 Repeated falls: Secondary | ICD-10-CM

## 2023-01-27 DIAGNOSIS — D649 Anemia, unspecified: Secondary | ICD-10-CM | POA: Diagnosis not present

## 2023-01-27 DIAGNOSIS — Z79899 Other long term (current) drug therapy: Secondary | ICD-10-CM

## 2023-01-27 DIAGNOSIS — Z881 Allergy status to other antibiotic agents status: Secondary | ICD-10-CM

## 2023-01-27 DIAGNOSIS — Z885 Allergy status to narcotic agent status: Secondary | ICD-10-CM

## 2023-01-27 DIAGNOSIS — W19XXXA Unspecified fall, initial encounter: Secondary | ICD-10-CM

## 2023-01-27 DIAGNOSIS — W06XXXA Fall from bed, initial encounter: Secondary | ICD-10-CM | POA: Diagnosis present

## 2023-01-27 DIAGNOSIS — I2510 Atherosclerotic heart disease of native coronary artery without angina pectoris: Secondary | ICD-10-CM | POA: Diagnosis present

## 2023-01-27 DIAGNOSIS — I10 Essential (primary) hypertension: Secondary | ICD-10-CM | POA: Diagnosis present

## 2023-01-27 DIAGNOSIS — Y92009 Unspecified place in unspecified non-institutional (private) residence as the place of occurrence of the external cause: Secondary | ICD-10-CM

## 2023-01-27 DIAGNOSIS — S32592A Other specified fracture of left pubis, initial encounter for closed fracture: Secondary | ICD-10-CM | POA: Diagnosis present

## 2023-01-27 DIAGNOSIS — Z7902 Long term (current) use of antithrombotics/antiplatelets: Secondary | ICD-10-CM

## 2023-01-27 DIAGNOSIS — Z96642 Presence of left artificial hip joint: Secondary | ICD-10-CM | POA: Diagnosis present

## 2023-01-27 DIAGNOSIS — F0390 Unspecified dementia without behavioral disturbance: Secondary | ICD-10-CM | POA: Diagnosis present

## 2023-01-27 DIAGNOSIS — S7002XA Contusion of left hip, initial encounter: Secondary | ICD-10-CM | POA: Diagnosis present

## 2023-01-27 DIAGNOSIS — Z8673 Personal history of transient ischemic attack (TIA), and cerebral infarction without residual deficits: Secondary | ICD-10-CM | POA: Diagnosis not present

## 2023-01-27 DIAGNOSIS — S32512A Fracture of superior rim of left pubis, initial encounter for closed fracture: Secondary | ICD-10-CM | POA: Diagnosis present

## 2023-01-27 DIAGNOSIS — S329XXA Fracture of unspecified parts of lumbosacral spine and pelvis, initial encounter for closed fracture: Secondary | ICD-10-CM

## 2023-01-27 DIAGNOSIS — Y92003 Bedroom of unspecified non-institutional (private) residence as the place of occurrence of the external cause: Secondary | ICD-10-CM | POA: Diagnosis not present

## 2023-01-27 DIAGNOSIS — F03B0 Unspecified dementia, moderate, without behavioral disturbance, psychotic disturbance, mood disturbance, and anxiety: Secondary | ICD-10-CM | POA: Diagnosis present

## 2023-01-27 DIAGNOSIS — S3210XA Unspecified fracture of sacrum, initial encounter for closed fracture: Secondary | ICD-10-CM | POA: Diagnosis present

## 2023-01-27 DIAGNOSIS — F03C0 Unspecified dementia, severe, without behavioral disturbance, psychotic disturbance, mood disturbance, and anxiety: Secondary | ICD-10-CM | POA: Diagnosis present

## 2023-01-27 LAB — BASIC METABOLIC PANEL
Anion gap: 8 (ref 5–15)
BUN: 24 mg/dL — ABNORMAL HIGH (ref 8–23)
CO2: 24 mmol/L (ref 22–32)
Calcium: 9.7 mg/dL (ref 8.9–10.3)
Chloride: 107 mmol/L (ref 98–111)
Creatinine, Ser: 0.9 mg/dL (ref 0.44–1.00)
GFR, Estimated: >60 mL/min (ref >60)
Glucose, Bld: 168 mg/dL — ABNORMAL HIGH (ref 70–99)
Potassium: 3.9 mmol/L (ref 3.5–5.1)
Sodium: 139 mmol/L (ref 135–145)

## 2023-01-27 LAB — PROTIME-INR
INR: 1.2 (ref 0.8–1.2)
Prothrombin Time: 15.8 seconds — ABNORMAL HIGH (ref 11.4–15.2)

## 2023-01-27 LAB — CBC
HCT: 34.4 % — ABNORMAL LOW (ref 36.0–46.0)
Hemoglobin: 11.7 g/dL — ABNORMAL LOW (ref 12.0–15.0)
MCH: 32.1 pg (ref 26.0–34.0)
MCHC: 34 g/dL (ref 30.0–36.0)
MCV: 94.2 fL (ref 80.0–100.0)
Platelets: 152 10*3/uL (ref 150–400)
RBC: 3.65 MIL/uL — ABNORMAL LOW (ref 3.87–5.11)
RDW: 13.7 % (ref 11.5–15.5)
WBC: 11.6 10*3/uL — ABNORMAL HIGH (ref 4.0–10.5)
nRBC: 0 % (ref 0.0–0.2)

## 2023-01-27 LAB — TYPE AND SCREEN
ABO/RH(D): A POS
Antibody Screen: NEGATIVE

## 2023-01-27 MED ORDER — METHOCARBAMOL 500 MG PO TABS
500.0000 mg | ORAL_TABLET | Freq: Four times a day (QID) | ORAL | Status: DC | PRN
  Administered 2023-01-29: 500 mg via ORAL
  Filled 2023-01-27: qty 1

## 2023-01-27 MED ORDER — MORPHINE SULFATE (PF) 2 MG/ML IV SOLN: 2.0000 mg | INTRAVENOUS | Status: DC | PRN

## 2023-01-27 MED ORDER — POLYETHYLENE GLYCOL 3350 17 G PO PACK: 17.0000 g | PACK | Freq: Every day | ORAL | Status: DC | PRN

## 2023-01-27 MED ORDER — DOCUSATE SODIUM 100 MG PO CAPS
100.0000 mg | ORAL_CAPSULE | Freq: Two times a day (BID) | ORAL | Status: DC
  Administered 2023-01-27 – 2023-01-31 (×7): 100 mg via ORAL
  Filled 2023-01-27 (×9): qty 1

## 2023-01-27 MED ORDER — LACTATED RINGERS IV SOLN: INTRAVENOUS | Status: AC

## 2023-01-27 MED ORDER — METHOCARBAMOL 1000 MG/10ML IJ SOLN: 500.0000 mg | Freq: Four times a day (QID) | INTRAVENOUS | Status: DC | PRN

## 2023-01-27 MED ORDER — BISACODYL 5 MG PO TBEC
5.0000 mg | DELAYED_RELEASE_TABLET | Freq: Every day | ORAL | Status: DC | PRN
  Administered 2023-01-30: 5 mg via ORAL
  Filled 2023-01-27: qty 1

## 2023-01-27 NOTE — Hospital Course (Addendum)
Caitlyn Burns is a 89 y.o. female with medical history significant for hypertension, dementia, arthritis, history of stroke presenting from home with falls.  Patient has been falling over the past 2 days no reports of striking her head she has a history of dementia  08/19 to ED from home. Imaging showed left inferior pubic rami fracture. Admitted to hospitalist service. Hgb 11.6 08/20: per ortho, no surgery, WBAT, PT/OT 08/21: PT/OT recs for SNF rehab, TOC working on placement 08/22: Hgb down to 9.3 --> recheck at 10.3 no s/s bleeding.  08/23: Hgb 10.5. 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 Audelia Acton - no surgical intervention is planned Orthopedic surgery recs weightbearing as tolerated with assistive devices and therapy Outpatient follow-up with orthopedics in 2 weeks for follow-up imaging Pain control SNF rehab  Anemia - improved/stable No s/s bleeding now  Suspect some hemodilutional  IV fluids have been d/c Monitor H/H   Frequent falls Fall at home, initial encounter PT OT  SNF rehab  Fall precaution.   Dementia  Will monitor . No reports of agitation or behavioral disturbance in PMH.  Continue home donepezil    CAD (coronary artery disease), native coronary artery Denies any acute chest pains Continue current statin therapy   Benign essential hypertension Continue lopressor.  Prn hydralazine.  Monitor blood pressure closely       DVT prophylaxis: lovenox Pertinent IV fluids/nutrition: no continuous IV fluids  Central lines / invasive devices: none  Code Status: FULL CODE ACP documentation reviewed: none on file   Current Admission Status: inpatient   TOC needs / Dispo plan: SNF/STR placement Barriers to discharge / significant pending items: placement

## 2023-01-27 NOTE — Assessment & Plan Note (Signed)
Will monitor . No reports of agitation or behavioral disturbance in PMH.

## 2023-01-27 NOTE — Assessment & Plan Note (Signed)
Orthopedic- Dr. Audelia Acton consulted by EDMD and will see pt in am.  I will place consult order.  Prn morphine for pain control.

## 2023-01-27 NOTE — Assessment & Plan Note (Signed)
Stable no chest pain sob hpi is limited due to age and dementia but pt appears comfortable.

## 2023-01-27 NOTE — ED Provider Notes (Addendum)
[facility] Provider Note    Event Date/Time   First MD Initiated Contact with Patient 01/27/23 1721     (approximate)   History   Fall   HPI  Caitlyn Burns is a 89 y.o. female past medical history significant for dementia, who presents to the emergency department following a fall.  Patient is fallen multiple times over the past couple of days.  This would be her third or fourth fall since Thursday.  Larey Seat out of bed and is having difficulty bearing weight to her left leg secondary to pain.  No head injury or loss of consciousness.  Ongoing confusion and is at her normal mental status baseline.  Evaluated on Saturday following a fall.  Ongoing pain to the left side.     Physical Exam   Triage Vital Signs: ED Triage Vitals  Encounter Vitals Group     BP 01/27/23 1627 (!) 135/110     Systolic BP Percentile --      Diastolic BP Percentile --      Pulse Rate 01/27/23 1627 71     Resp 01/27/23 1627 15     Temp 01/27/23 1627 98.6 F (37 C)     Temp Source 01/27/23 1627 Oral     SpO2 01/27/23 1627 96 %     Weight 01/27/23 1626 109 lb (49.4 kg)     Height 01/27/23 1626 5\' 3"  (1.6 m)     Head Circumference --      Peak Flow --      Pain Score --      Pain Loc --      Pain Education --      Exclude from Growth Chart --     Most recent vital signs: Vitals:   01/27/23 1627  BP: (!) 135/110  Pulse: 71  Resp: 15  Temp: 98.6 F (37 C)  SpO2: 96%    Physical Exam Constitutional:      Appearance: She is well-developed.     Comments: Smiling and eating a sandwich  HENT:     Head: Atraumatic.  Eyes:     Conjunctiva/sclera: Conjunctivae normal.  Cardiovascular:     Rate and Rhythm: Regular rhythm.  Pulmonary:     Effort: No respiratory distress.  Abdominal:     General: There is no distension.  Musculoskeletal:     Cervical back: Normal range of motion.     Comments: Tenderness palpation to the left hip.  No tenderness to bilateral lower legs.   No tenderness to bilateral upper extremities.  No midline cervical, thoracic or lumbar tenderness to palpation  Skin:    General: Skin is warm.  Neurological:     Mental Status: She is alert. Mental status is at baseline.      IMPRESSION / MDM / ASSESSMENT AND PLAN / ED COURSE  I reviewed the triage vital signs and the nursing notes.  Differential diagnosis including fracture, dislocation, musculoskeletal strain   RADIOLOGY my interpretation of imaging: X-ray of the left hip with acute superior and inferior pubic ramus fractures.  No sign of hip fracture or dislocation.  CT scan of the head and cervical spine with no acute fracture or intracranial hemorrhage.  Currently waiting for formal read.   Labs (all labs ordered are listed, but only abnormal results are displayed) Labs interpreted as -    Labs Reviewed  CBC - Abnormal; Notable for the following components:      Result Value   WBC  11.6 (*)    RBC 3.65 (*)    Hemoglobin 11.7 (*)    HCT 34.4 (*)    All other components within normal limits  BASIC METABOLIC PANEL  PROTIME-INR      Patient with frequent falls and dementia.  Found to have superior inferior pubic ramus fracture to the left side.  Neurovascularly intact.  Consulted and discussed the patient's case with orthopedics.  Consulted hospitalist service for admission for fall and left hip pain with pelvic fracture.  Orthopedics, Dr. Audelia Acton recommended Ct w/o pelvic -likely nonoperative.  Recommended admission to the hospitalist.   PROCEDURES:  Critical Care performed: No  Procedures  Patient's presentation is most consistent with acute complicated illness / injury requiring diagnostic workup.   MEDICATIONS ORDERED IN ED: Medications - No data to display  FINAL CLINICAL IMPRESSION(S) / ED DIAGNOSES   Final diagnoses:  Fall, initial encounter  Closed displaced fracture of pelvis, unspecified part of pelvis, initial encounter (HCC)     Rx / DC  Orders   ED Discharge Orders     None        Note:  This document was prepared using Dragon voice recognition software and may include unintentional dictation errors.   Corena Herter, MD 01/27/23 Aldona Lento    Corena Herter, MD 01/27/23 1859

## 2023-01-27 NOTE — Assessment & Plan Note (Signed)
-   Fall precaution 

## 2023-01-27 NOTE — Assessment & Plan Note (Signed)
Pt h/o falls at home fall precaution.  ? SNF or Home health per family and ortho.

## 2023-01-27 NOTE — H&P (Signed)
History and Physical    Patient: Caitlyn Burns ION:[id number] DOB: 25-Mar-1931 DOA: 01/27/2023 DOS: the patient was seen and examined on 01/27/2023 PCP: Lynnea Ferrier, MD  Patient coming from: Home   Chief Complaint:  Chief Complaint  Patient presents with   Fall    HPI: Caitlyn Burns is a 89 y.o. female with medical history significant for hypertension, dementia, arthritis, history of stroke presenting from home with falls.  Patient has been falling over the past 2 days no reports of striking her head she has a history of dementia and is not baseline, she is on Plavix.  No reports of any bleeding or incontinence.  Imaging showed left inferior pubic rami fracture and  orthopedic was consulted and will consult on patient in a.m.  Today specifically in the morning patient was up at around 2 ambulating to use the restroom and patient's feet got tangled up fell on her left side of left leg left hip on a wooden floor and since then she has not been able to bear weight on it and has severe pain.  No loss of consciousness reported. On presentation initial vitals are 98.6 pulse of 71 blood pressure 135/110 O2 sats 96% on room air, BMP shows glucose of 168, CBC shows white count of 11.6 hemoglobin of 11.7 which is mildly low than yesterday.  Review of Systems: Review of Systems  Unable to perform ROS: Age    Past Medical History:  Diagnosis Date   Arthritis    Hypertension    Leaky heart valve    Stroke (HCC)    tia   Past Surgical History:  Procedure Laterality Date   CARDIAC CATHETERIZATION     CATARACT EXTRACTION W/PHACO Left 04/23/2016   Procedure: CATARACT EXTRACTION PHACO AND INTRAOCULAR LENS PLACEMENT (IOC);  Surgeon: Galen Manila, MD;  Location: [facility] ORS;  Service: Ophthalmology;  Laterality: Left;  [place] 59.9 AP% 20.9 CDE 12.48 FLUID PACK LOT # [id number]   FRACTURE SURGERY     orif shoulder   JOINT REPLACEMENT     thr   Social History:   reports that she has never smoked.  She has never used smokeless tobacco. She reports that she does not drink alcohol and does not use drugs.  Allergies  Allergen Reactions   Doxycycline Nausea And Vomiting   Tramadol     History reviewed. No pertinent family history.  Prior to Admission medications   Medication Sig Start Date End Date Taking? Authorizing Provider  acetaminophen (TYLENOL) 500 MG tablet Take 500 mg by mouth daily as needed for moderate pain or headache.    [provider]  Calcium Carb-Cholecalciferol (CALCIUM 600+D) 600-800 MG-UNIT TABS Take 1 tablet by mouth daily.    [provider]  clopidogrel (PLAVIX) 75 MG tablet Take 75 mg by mouth daily.    [provider]  Cyanocobalamin (B-12 PO) Take 1 tablet by mouth daily at 12 noon.    [provider]  diclofenac Sodium (VOLTAREN) 1 % GEL Apply 2 g topically 4 (four) times daily. 01/25/23   Sharman Cheek, MD  Ketotifen Fumarate (ALAWAY OP) Apply 1 drop to eye daily as needed (dry eyes).    [provider]  Providence Lanius 350 MG CAPS Take 1 capsule by mouth daily.    [provider]  metoprolol tartrate (LOPRESSOR) 25 MG tablet Take 12.5 mg by mouth 2 (two) times daily.    [provider]  Multiple Vitamins-Minerals (CENTRUM SILVER PO) Take 1 tablet by  mouth daily.    [provider]  Multiple Vitamins-Minerals (OCUVITE PO) Take 1 tablet by mouth daily.    [provider]  pravastatin (PRAVACHOL) 20 MG tablet Take 20 mg by mouth at bedtime.    [provider]  triamterene-hydrochlorothiazide (MAXZIDE-25) 37.5-25 MG tablet Take 0.5 tablets by mouth daily at 12 noon.    [provider]     Vitals:   01/27/23 1626 01/27/23 1627  BP:  (!) 135/110  Pulse:  71  Resp:  15  Temp:  98.6 F (37 C)  TempSrc:  Oral  SpO2:  96%  Weight: 49.4 kg   Height: 5\' 3"  (1.6 m)    Physical Exam Vitals and nursing note reviewed.  Constitutional:      General: She is not in  acute distress. HENT:     Head: Normocephalic and atraumatic.     Right Ear: Hearing normal.     Left Ear: Hearing normal.     Nose: Nose normal. No nasal deformity.     Mouth/Throat:     Lips: Pink.     Tongue: No lesions.     Pharynx: Oropharynx is clear.  Eyes:     General: Lids are normal.  Cardiovascular:     Rate and Rhythm: Normal rate and regular rhythm.     Pulses:          Dorsalis pedis pulses are 2+ on the right side and 2+ on the left side.       Posterior tibial pulses are 2+ on the right side and 2+ on the left side.     Heart sounds: Normal heart sounds.  Pulmonary:     Effort: Pulmonary effort is normal.     Breath sounds: Normal breath sounds.  Abdominal:     General: Bowel sounds are normal. There is no distension.     Palpations: There is no mass.     Tenderness: There is no abdominal tenderness.  Musculoskeletal:     Right lower leg: No edema.     Left lower leg: No edema.  Skin:    General: Skin is warm.  Neurological:     General: No focal deficit present.     Mental Status: She is alert.     Cranial Nerves: Cranial nerves 2-12 are intact.  Psychiatric:        Attention and Perception: Attention normal.        Mood and Affect: Mood normal.        Speech: Speech normal.        Behavior: Behavior is cooperative.     Labs on Admission: I have personally reviewed following labs and imaging studies.  CBC: Recent Labs  Lab 01/25/23 1159 01/27/23 1822  WBC 6.6 11.6*  NEUTROABS 4.8  --   HGB 12.2 11.7*  HCT 37.1 34.4*  MCV 96.9 94.2  PLT 172 152   Basic Metabolic Panel: Recent Labs  Lab 01/25/23 1159 01/27/23 1822  NA 138 139  K 4.2 3.9  CL 109 107  CO2 24 24  GLUCOSE 93 168*  BUN 14 24*  CREATININE 0.85 0.90  CALCIUM 8.5* 9.7   GFR: Estimated Creatinine Clearance: 32.4 mL/min (by C-G formula based on SCr of 0.9 mg/dL). Liver Function Tests: No results for input(s): "AST", "ALT", "ALKPHOS", "BILITOT", "PROT", "ALBUMIN" in the last  168 hours. No results for input(s): "LIPASE", "AMYLASE" in the last 168 hours. No results for input(s): "AMMONIA" in the last 168 hours. Coagulation  Profile: Recent Labs  Lab 01/27/23 1822  INR 1.2   Cardiac Enzymes: No results for input(s): "CKTOTAL", "CKMB", "CKMBINDEX", "TROPONINI" in the last 168 hours. BNP (last 3 results) No results for input(s): "PROBNP" in the last 8760 hours. HbA1C: No results for input(s): "HGBA1C" in the last 72 hours. CBG: No results for input(s): "GLUCAP" in the last 168 hours. Lipid Profile: No results for input(s): "CHOL", "HDL", "LDLCALC", "TRIG", "CHOLHDL", "LDLDIRECT" in the last 72 hours. Thyroid Function Tests: No results for input(s): "TSH", "T4TOTAL", "FREET4", "T3FREE", "THYROIDAB" in the last 72 hours. Anemia Panel: No results for input(s): "VITAMINB12", "FOLATE", "FERRITIN", "TIBC", "IRON", "RETICCTPCT" in the last 72 hours. Urinalysis    Component Value Date/Time   COLORURINE YELLOW (A) 01/25/2023 1301   APPEARANCEUR HAZY (A) 01/25/2023 1301   LABSPEC 1.026 01/25/2023 1301   PHURINE 5.0 01/25/2023 1301   GLUCOSEU NEGATIVE 01/25/2023 1301   HGBUR NEGATIVE 01/25/2023 1301   BILIRUBINUR NEGATIVE 01/25/2023 1301   KETONESUR 5 (A) 01/25/2023 1301   PROTEINUR NEGATIVE 01/25/2023 1301   NITRITE NEGATIVE 01/25/2023 1301   LEUKOCYTESUR NEGATIVE 01/25/2023 1301    Unresulted Labs (From admission, onward)     Start     Ordered   01/28/23 0500  CBC  Tomorrow morning,   R        01/27/23 1919   01/28/23 0500  Basic metabolic panel  Tomorrow morning,   R        01/27/23 1919   01/27/23 1918  Type and screen [facility]  Once,   R       Comments: [facility]    01/27/23 1919            Medications  methocarbamol (ROBAXIN) tablet 500 mg (has no administration in time range)    Or  methocarbamol (ROBAXIN) 500 mg in dextrose 5 % 50 mL IVPB (has no administration in time range)  docusate  sodium (COLACE) capsule 100 mg (has no administration in time range)  polyethylene glycol (MIRALAX / GLYCOLAX) packet 17 g (has no administration in time range)  bisacodyl (DULCOLAX) EC tablet 5 mg (has no administration in time range)  lactated ringers infusion (has no administration in time range)  morphine (PF) 2 MG/ML injection 2 mg (has no administration in time range)    Radiological Exams on Admission: CT Head Wo Contrast  Result Date: 01/27/2023 CLINICAL DATA:  Trauma. EXAM: CT HEAD WITHOUT CONTRAST CT CERVICAL SPINE WITHOUT CONTRAST TECHNIQUE: Multidetector CT imaging of the head and cervical spine was performed following the standard protocol without intravenous contrast. Multiplanar CT image reconstructions of the cervical spine were also generated. RADIATION DOSE REDUCTION: This exam was performed according to the departmental dose-optimization program which includes automated exposure control, adjustment of the mA and/or kV according to patient size and/or use of iterative reconstruction technique. COMPARISON:  Head CT 01/25/2023. FINDINGS: CT HEAD FINDINGS Brain: There is periventricular white matter decreased attenuation consistent with small vessel ischemic changes. Ventricles, sulci and cisterns are prominent consistent with age related involutional changes. No acute intracranial hemorrhage, mass effect or shift. No hydrocephalus. Vascular: No hyperdense vessel or unexpected calcification. Skull: Normal. Negative for fracture or focal lesion. Sinuses/Orbits: No acute finding. CT CERVICAL SPINE FINDINGS Alignment: Normal. Skull base and vertebrae: No acute fracture. No primary bone lesion or focal pathologic process. Soft tissues and spinal canal: No prevertebral fluid or swelling. No visible canal hematoma. Disc levels: Disc space narrowing with sclerosis and marginal osteophytes consistent  with degenerative disc disease C4-C7. Facet joint osteoarthritic changes C3-4 through C7-T1. Upper  chest: Biapical pleural/parenchymal changes likely representing scarring. IMPRESSION: 1. Atrophy and chronic small vessel ischemic changes. 2. No acute intracranial process identified. 3. Cervical degenerative changes. 4. No acute traumatic abnormalities of the cervical spine. Electronically Signed   By: Layla Maw M.D.   On: 01/27/2023 18:22   CT Cervical Spine Wo Contrast  Result Date: 01/27/2023 CLINICAL DATA:  Trauma. EXAM: CT HEAD WITHOUT CONTRAST CT CERVICAL SPINE WITHOUT CONTRAST TECHNIQUE: Multidetector CT imaging of the head and cervical spine was performed following the standard protocol without intravenous contrast. Multiplanar CT image reconstructions of the cervical spine were also generated. RADIATION DOSE REDUCTION: This exam was performed according to the departmental dose-optimization program which includes automated exposure control, adjustment of the mA and/or kV according to patient size and/or use of iterative reconstruction technique. COMPARISON:  Head CT 01/25/2023. FINDINGS: CT HEAD FINDINGS Brain: There is periventricular white matter decreased attenuation consistent with small vessel ischemic changes. Ventricles, sulci and cisterns are prominent consistent with age related involutional changes. No acute intracranial hemorrhage, mass effect or shift. No hydrocephalus. Vascular: No hyperdense vessel or unexpected calcification. Skull: Normal. Negative for fracture or focal lesion. Sinuses/Orbits: No acute finding. CT CERVICAL SPINE FINDINGS Alignment: Normal. Skull base and vertebrae: No acute fracture. No primary bone lesion or focal pathologic process. Soft tissues and spinal canal: No prevertebral fluid or swelling. No visible canal hematoma. Disc levels: Disc space narrowing with sclerosis and marginal osteophytes consistent with degenerative disc disease C4-C7. Facet joint osteoarthritic changes C3-4 through C7-T1. Upper chest: Biapical pleural/parenchymal changes likely  representing scarring. IMPRESSION: 1. Atrophy and chronic small vessel ischemic changes. 2. No acute intracranial process identified. 3. Cervical degenerative changes. 4. No acute traumatic abnormalities of the cervical spine. Electronically Signed   By: Layla Maw M.D.   On: 01/27/2023 18:22   DG Femur Min 2 Views Left  Result Date: 01/27/2023 CLINICAL DATA:  Fall, initial encounter.  Left hip and leg pain. EXAM: PELVIS - 1-2 VIEW; LEFT FEMUR 2 VIEWS COMPARISON:  None Available. FINDINGS: Pelvis: Displaced left inferior pubic ramus fracture. There is a mildly comminuted and displaced fracture the left superior ramus abutting the pubic body. Fracture may extend to the pubic symphysis. No definite disruption of sacral ala. Moderate right hip osteoarthritis. Femur: Left hip arthroplasty in expected alignment. There is no periprosthetic lucency or fracture. The distal femur is intact. Knee alignment is maintained. No focal soft tissue abnormalities. IMPRESSION: 1. Displaced left inferior pubic ramus fracture. 2. Mildly comminuted and displaced fracture of the left superior pubic ramus abutting the pubic body. Fracture may extend to the pubic symphysis. 3. No fracture of the left hip or femur. Left hip arthroplasty is intact. Electronically Signed   By: Narda Rutherford M.D.   On: 01/27/2023 17:15   DG Pelvis 1-2 Views  Result Date: 01/27/2023 CLINICAL DATA:  Fall, initial encounter.  Left hip and leg pain. EXAM: PELVIS - 1-2 VIEW; LEFT FEMUR 2 VIEWS COMPARISON:  None Available. FINDINGS: Pelvis: Displaced left inferior pubic ramus fracture. There is a mildly comminuted and displaced fracture the left superior ramus abutting the pubic body. Fracture may extend to the pubic symphysis. No definite disruption of sacral ala. Moderate right hip osteoarthritis. Femur: Left hip arthroplasty in expected alignment. There is no periprosthetic lucency or fracture. The distal femur is intact. Knee alignment is  maintained. No focal soft tissue abnormalities. IMPRESSION: 1. Displaced left inferior  pubic ramus fracture. 2. Mildly comminuted and displaced fracture of the left superior pubic ramus abutting the pubic body. Fracture may extend to the pubic symphysis. 3. No fracture of the left hip or femur. Left hip arthroplasty is intact. Electronically Signed   By: Narda Rutherford M.D.   On: 01/27/2023 17:15    Data Reviewed: Relevant notes from primary care and specialist visits, past discharge summaries as available in EHR, including Care Everywhere. Prior diagnostic testing as pertinent to current admission diagnoses Updated medications and problem lists for reconciliation ED course, including vitals, labs, imaging, treatment and response to treatment Triage notes, nursing and pharmacy notes and ED provider's notes Notable results as noted in HPI  Assessment and Plan: * Frequent falls Fall precaution.  Fall at home, initial encounter Pt h/o falls at home fall precaution.  ? SNF or Home health per family and ortho.  Inferior pubic ramus fracture, left, closed, initial encounter [facility]) Orthopedic- Dr. Audelia Acton consulted by EDMD and will see pt in am.  I will place consult order.  Prn morphine for pain control.   Dementia arising in the senium and presenium (HCC) Will monitor . No reports of agitation or behavioral disturbance in PMH.   CAD (coronary artery disease), native coronary artery Stable no chest pain sob hpi is limited due to age and dementia but pt appears comfortable.   Benign essential hypertension Vitals:   01/27/23 1627  BP: (!) 135/110  Continue lopressor.  Prn hydralazine.  Suspect pt may be orthostatic and will change diuretic dose to hydrochlorothiazide 6.25 mg.     DVT prophylaxis:  SCD's  Consults:  Orthopedic: Dr. Arty Baumgartner   Advance Care Planning:    Code Status: Full Code   Family Communication:  None.   Disposition Plan:  TBD  Severity of  Illness: The appropriate patient status for this patient is INPATIENT. Inpatient status is judged to be reasonable and necessary in order to provide the required intensity of service to ensure the patient's safety. The patient's presenting symptoms, physical exam findings, and initial radiographic and laboratory data in the context of their chronic comorbidities is felt to place them at high risk for further clinical deterioration. Furthermore, it is not anticipated that the patient will be medically stable for discharge from the hospital within 2 midnights of admission.   * I certify that at the point of admission it is my clinical judgment that the patient will require inpatient hospital care spanning beyond 2 midnights from the point of admission due to high intensity of service, high risk for further deterioration and high frequency of surveillance required.*  Author: Gertha Calkin, MD 01/27/2023 7:28 PM  For on call review [email or web address].

## 2023-01-27 NOTE — ED Triage Notes (Signed)
Pt fell Friday morning at 0200 while walking to the bathroom. Pt husband reports her feet got tangled up while walking. Pt fell on her L leg and L hip onto a wooden floor. Pt did not hit her head and no LOC. Husband reports pt c/o increased pain to L hip/leg when she stands to walk. No obvious deformity present in triage.

## 2023-01-27 NOTE — Assessment & Plan Note (Signed)
Vitals:   01/27/23 1627  BP: (!) 135/110  Continue lopressor.  Prn hydralazine.  Suspect pt may be orthostatic and will change diuretic dose to hydrochlorothiazide 6.25 mg.

## 2023-01-28 DIAGNOSIS — S3210XA Unspecified fracture of sacrum, initial encounter for closed fracture: Secondary | ICD-10-CM

## 2023-01-28 DIAGNOSIS — S32512A Fracture of superior rim of left pubis, initial encounter for closed fracture: Secondary | ICD-10-CM

## 2023-01-28 DIAGNOSIS — R296 Repeated falls: Secondary | ICD-10-CM | POA: Diagnosis not present

## 2023-01-28 LAB — CBC
HCT: 30.3 % — ABNORMAL LOW (ref 36.0–46.0)
Hemoglobin: 10.4 g/dL — ABNORMAL LOW (ref 12.0–15.0)
MCH: 31.8 pg (ref 26.0–34.0)
MCHC: 34.3 g/dL (ref 30.0–36.0)
MCV: 92.7 fL (ref 80.0–100.0)
Platelets: 140 10*3/uL — ABNORMAL LOW (ref 150–400)
RBC: 3.27 MIL/uL — ABNORMAL LOW (ref 3.87–5.11)
RDW: 13.6 % (ref 11.5–15.5)
WBC: 9.8 10*3/uL (ref 4.0–10.5)
nRBC: 0 % (ref 0.0–0.2)

## 2023-01-28 LAB — BASIC METABOLIC PANEL
Anion gap: 7 (ref 5–15)
BUN: 23 mg/dL (ref 8–23)
CO2: 22 mmol/L (ref 22–32)
Calcium: 8.7 mg/dL — ABNORMAL LOW (ref 8.9–10.3)
Chloride: 111 mmol/L (ref 98–111)
Creatinine, Ser: 0.76 mg/dL (ref 0.44–1.00)
GFR, Estimated: >60 mL/min (ref >60)
Glucose, Bld: 122 mg/dL — ABNORMAL HIGH (ref 70–99)
Potassium: 3.9 mmol/L (ref 3.5–5.1)
Sodium: 140 mmol/L (ref 135–145)

## 2023-01-28 MED ORDER — PRAVASTATIN SODIUM 20 MG PO TABS
20.0000 mg | ORAL_TABLET | Freq: Every day | ORAL | Status: DC
  Administered 2023-01-28 – 2023-01-30 (×3): 20 mg via ORAL
  Filled 2023-01-28 (×3): qty 1

## 2023-01-28 MED ORDER — ENOXAPARIN SODIUM 40 MG/0.4ML IJ SOSY
40.0000 mg | PREFILLED_SYRINGE | INTRAMUSCULAR | Status: DC
  Administered 2023-01-28 – 2023-01-30 (×3): 40 mg via SUBCUTANEOUS
  Filled 2023-01-28 (×3): qty 0.4

## 2023-01-28 NOTE — Plan of Care (Signed)

## 2023-01-28 NOTE — Progress Notes (Signed)
PT Cancellation Note  Patient Details Name: Caitlyn Burns MRN: [id number] DOB: 31-Jan-1931   Cancelled Treatment:    Reason Eval/Treat Not Completed: Patient not medically ready Orders received, chart reviewed. Patient is waiting for orthopedic consult for L inferior pubic ramus fx. Will await further instruction on WB status and plan.   Maylon Peppers, PT, DPT Physical Therapist - [place]  [facility]  Ireanna Finlayson A Calan Doren 01/28/2023, 9:30 AM

## 2023-01-28 NOTE — Progress Notes (Signed)
OT Cancellation Note  Patient Details Name: Asley Mapa MRN: [id number] DOB: 1931/05/27   Cancelled Treatment:    Reason Eval/Treat Not Completed: Other (comment). Patient is waiting for orthopedic consult for L inferior pubic ramus fx. Will await further instruction on WB status and plan.   Arman Filter., MPH, MS, OTR/L ascom [phone] 01/28/23, 11:06 AM

## 2023-01-28 NOTE — Progress Notes (Signed)
Progress Note   Patient: Caitlyn Burns QMV:[id number] DOB: 1931/03/05 DOA: 01/27/2023     1 DOS: the patient was seen and examined on 01/28/2023   Subjective:  Patient seen and examined in the presence of the family, daughter and son as well as husband Orthopedic surgeon has seen patient and not planning any surgical intervention PT OT have been requested, patient may need placement depending on her clinical course Denies any acute pain, nausea vomiting Laying in bed exhibiting symptoms of dementia    Brief hospital course: From HPI "Caitlyn Burns is a 89 y.o. female with medical history significant for hypertension, dementia, arthritis, history of stroke presenting from home with falls.  Patient has been falling over the past 2 days no reports of striking her head she has a history of dementia and is not baseline, she is on Plavix.  No reports of any bleeding or incontinence.  Imaging showed left inferior pubic rami fracture and  orthopedic was consulted and will consult on patient in a.m.  Today specifically in the morning patient was up at around 2 ambulating to use the restroom and patient's feet got tangled up fell on her left side of left leg left hip on a wooden floor and since then she has not been able to bear weight on it and has severe pain.  No loss of consciousness reported. On presentation initial vitals are 98.6 pulse of 71 blood pressure 135/110 O2 sats 96% on room air, BMP shows glucose of 168, CBC shows white count of 11.6 hemoglobin of 11.7 which is mildly low than yesterday.  "  Assessment and Plan: Frequent falls Fall precaution.   Fall at home, initial encounter PT OT on board, mentation pending   Inferior pubic ramus fracture, left, closed, initial encounter [facility]) Orthopedic- Dr. Audelia Acton consulted  Currently no surgical intervention is planned CT scan of the pelvic  showed acute comminuted fracture of the left superior pubic ramus extending into the pubic body PT OT  consulted we appreciate input Transition of care manager on board Orthopedic surgery recommends weightbearing as tolerated with assistive devices and therapy Outpatient follow-up with orthopedics in 2 weeks for follow-up serial imaging as an outpatient  Dementia arising in the senium and presenium (HCC) Will monitor . No reports of agitation or behavioral disturbance in PMH.    CAD (coronary artery disease), native coronary artery Denies any acute chest pains Continue current statin therapy  Benign essential hypertension Continue lopressor.  Prn hydralazine.  Monitor blood pressure closely       DVT prophylaxis: Lovenox   Consults:  Orthopedic: Dr. Arty Baumgartner    Advance Care Planning: Full code   Family Communication: Discussed with patient's son, daughter as well as husband present at bedside   Disposition Plan: PT OT recommendation pending, I anticipate patient may need rehab    Physical Exam: General: She is not in acute distress. HENT: Atraumatic normocephalic Eyes:     General: Lids are normal.  Cardiovascular:     Rate and Rhythm: Normal rate and regular rhythm.  Pulmonary:     Effort: Pulmonary effort is normal.     Breath sounds: Normal breath sounds.  Abdominal:     General: Bowel sounds are normal. There is no distension.     Palpations: There is no mass.     Tenderness: There is no abdominal tenderness.  Musculoskeletal:     Right lower leg: No edema.     Left lower leg: No edema.  Skin:  General: Skin is warm.  Neurological: Patient in bed exhibiting symptoms of dementia  Vitals:   01/27/23 1627 01/27/23 2104 01/28/23 0028 01/28/23 0805  BP: (!) 135/110 (!) 150/68 130/65 (!) 142/54  Pulse: 71 80 82 86  Resp: 15 18 18 17   Temp: 98.6 F (37 C) 97.8 F (36.6 C) (!) 97.2 F (36.2 C) 98.7 F (37.1 C)  TempSrc: Oral     SpO2: 96% 100% 92% 97%  Weight:      Height:        Data Reviewed: Reviewed patient's CT scan of the pelvic that showed  acute comminuted fracture of the left superior pubic ramus extending into the pubic body.  I have also reviewed patient's CBC, BMP as well as vitals as shown below    Latest Ref Rng & Units 01/28/2023    4:21 AM 01/27/2023    6:22 PM 01/25/2023   11:59 AM  CBC  WBC 4.0 - 10.5 K/uL 9.8  11.6  6.6   Hemoglobin 12.0 - 15.0 g/dL 69.6  29.5  28.4   Hematocrit 36.0 - 46.0 % 30.3  34.4  37.1   Platelets 150 - 400 K/uL 140  152  172        Latest Ref Rng & Units 01/28/2023    4:21 AM 01/27/2023    6:22 PM 01/25/2023   11:59 AM  CMP  Glucose 70 - 99 mg/dL 132  440  93   BUN 8 - 23 mg/dL 23  24  14    Creatinine 0.44 - 1.00 mg/dL 1.02  7.25  3.66   Sodium 135 - 145 mmol/L 140  139  138   Potassium 3.5 - 5.1 mmol/L 3.9  3.9  4.2   Chloride 98 - 111 mmol/L 111  107  109   CO2 22 - 32 mmol/L 22  24  24    Calcium 8.9 - 10.3 mg/dL 8.7  9.7  8.5      Author: Loyce Dys, MD 01/28/2023 3:20 PM  For on call review [email or web address].

## 2023-01-28 NOTE — Consult Note (Signed)
ORTHOPAEDIC CONSULTATION  REQUESTING PHYSICIAN: Loyce Dys, MD  Chief Complaint:   Pelvic fractures  History of Present Illness: Caitlyn Burns is a 89 y.o. female with past medical history of dementia who has had several falls over the last week.  She fell out of bed yesterday and began having difficulty bearing weight after the fall secondary to left leg pain and came into the emergency room for evaluation.  Seen at bedside with daughter and husband.  They deny any head injury or loss of consciousness from this most recent fall.  Confusion and dementia are her baseline mental status however she is able to answer simple questions and follow commands.  She reports pain over her left groin and pain with any walking localized to her left groin.  She does have a history of left hip replacement in the remote past which has been doing well without any issues.  Past Medical History:  Diagnosis Date   Arthritis    Hypertension    Leaky heart valve    Stroke (HCC)    tia   Past Surgical History:  Procedure Laterality Date   CARDIAC CATHETERIZATION     CATARACT EXTRACTION W/PHACO Left 04/23/2016   Procedure: CATARACT EXTRACTION PHACO AND INTRAOCULAR LENS PLACEMENT (IOC);  Surgeon: Galen Manila, MD;  Location: [facility] ORS;  Service: Ophthalmology;  Laterality: Left;  [place] 59.9 AP% 20.9 CDE 12.48 FLUID PACK LOT # [id number]   FRACTURE SURGERY     orif shoulder   JOINT REPLACEMENT     thr   Social History   Socioeconomic History   Marital status: Married    Spouse name: Not on file   Number of children: Not on file   Years of education: Not on file   Highest education level: Not on file  Occupational History   Not on file  Tobacco Use   Smoking status: Never   Smokeless tobacco: Never  Substance and Sexual Activity   Alcohol use: No   Drug use: Never   Sexual activity: Not Currently  Other Topics Concern   Not on file   Social History Narrative   Not on file   Social Determinants of Health   Financial Resource Strain: Not on file  Food Insecurity: No Food Insecurity (01/27/2023)   Hunger Vital Sign    Worried About Running Out of Food in the Last Year: Never true    Ran Out of Food in the Last Year: Never true  Transportation Needs: No Transportation Needs (01/27/2023)   PRAPARE - Administrator, Civil Service (Medical): No    Lack of Transportation (Non-Medical): No  Physical Activity: Not on file  Stress: Not on file  Social Connections: Not on file   History reviewed. No pertinent family history. Allergies  Allergen Reactions   Doxycycline Nausea And Vomiting   Tramadol    Prior to Admission medications   Medication Sig Start Date End Date Taking? Authorizing Provider  acetaminophen (TYLENOL) 500 MG tablet Take 500 mg by mouth daily as needed for moderate pain or headache.   Yes [provider]  Calcium Carb-Cholecalciferol (CALCIUM 600+D) 600-800 MG-UNIT TABS Take 1 tablet by mouth daily.   Yes [provider]  clopidogrel (PLAVIX) 75 MG tablet Take 75 mg by mouth daily.   Yes [provider]  Cyanocobalamin (B-12 PO) Take 1 tablet by mouth daily at 12 noon.   Yes [provider]  diclofenac Sodium (VOLTAREN) 1 % GEL Apply 2 g topically  4 (four) times daily. 01/25/23  Yes Sharman Cheek, MD  donepezil (ARICEPT) 5 MG tablet Take 5 mg by mouth daily. 01/25/22  Yes [provider]  Ketotifen Fumarate (ALAWAY OP) Apply 1 drop to eye daily as needed (dry eyes).   Yes [provider]  [place] Lanius 350 MG CAPS Take 1 capsule by mouth daily.   Yes [provider]  metoprolol succinate (TOPROL-XL) 25 MG 24 hr tablet Take 25 mg by mouth daily.   Yes [provider]  Multiple Vitamins-Minerals (CENTRUM SILVER PO) Take 1 tablet by mouth daily.   Yes [provider]  pravastatin (PRAVACHOL) 20 MG tablet Take 20 mg by  mouth at bedtime.   Yes [provider]  metoprolol tartrate (LOPRESSOR) 25 MG tablet Take 12.5 mg by mouth 2 (two) times daily. Patient not taking: Reported on 01/27/2023    [provider]  Multiple Vitamins-Minerals (OCUVITE PO) Take 1 tablet by mouth daily. Patient not taking: Reported on 01/27/2023    [provider]  triamterene-hydrochlorothiazide (MAXZIDE-25) 37.5-25 MG tablet Take 0.5 tablets by mouth daily at 12 noon. Patient not taking: Reported on 01/27/2023    [provider]   CT PELVIS WO CONTRAST  Result Date: 01/27/2023 CLINICAL DATA:  89 year old female with dementia who presents after fall. Patient has fallen multiple times over the last few days. Evaluate fracture seen on radiograph 01/27/2023 EXAM: CT PELVIS WITHOUT CONTRAST TECHNIQUE: Multidetector CT imaging of the pelvis was performed following the standard protocol without intravenous contrast. RADIATION DOSE REDUCTION: This exam was performed according to the departmental dose-optimization program which includes automated exposure control, adjustment of the mA and/or kV according to patient size and/or use of iterative reconstruction technique. COMPARISON:  Radiograph 01/27/2023 FINDINGS: Urinary Tract:  No abnormality visualized. Bowel:  Unremarkable visualized pelvic bowel loops. Vascular/Lymphatic: Arterial atherosclerotic calcification. No lymphadenopathy. Reproductive:  No acute abnormality. Other:  Small amount of blood in the pelvis. Musculoskeletal: Acute comminuted fracture of the left superior pubic ramus extending into the pubic body. Additional displaced fracture of the left inferior pubic ramus. Mildly displaced right sacral ala fractures. Left hip hemiarthroplasty. IMPRESSION: 1. Acute comminuted fracture of the left superior pubic ramus extending into the pubic body. 2. Mildly displaced fracture of the left inferior pubic ramus. 3. Mildly displaced right sacral ala fractures. 4.  Small volume extraperitoneal blood in the pelvis. Electronically Signed   By: Minerva Fester M.D.   On: 01/27/2023 20:31   CT Head Wo Contrast  Result Date: 01/27/2023 CLINICAL DATA:  Trauma. EXAM: CT HEAD WITHOUT CONTRAST CT CERVICAL SPINE WITHOUT CONTRAST TECHNIQUE: Multidetector CT imaging of the head and cervical spine was performed following the standard protocol without intravenous contrast. Multiplanar CT image reconstructions of the cervical spine were also generated. RADIATION DOSE REDUCTION: This exam was performed according to the departmental dose-optimization program which includes automated exposure control, adjustment of the mA and/or kV according to patient size and/or use of iterative reconstruction technique. COMPARISON:  Head CT 01/25/2023. FINDINGS: CT HEAD FINDINGS Brain: There is periventricular white matter decreased attenuation consistent with small vessel ischemic changes. Ventricles, sulci and cisterns are prominent consistent with age related involutional changes. No acute intracranial hemorrhage, mass effect or shift. No hydrocephalus. Vascular: No hyperdense vessel or unexpected calcification. Skull: Normal. Negative for fracture or focal lesion. Sinuses/Orbits: No acute finding. CT CERVICAL SPINE FINDINGS Alignment: Normal. Skull base and vertebrae: No acute fracture. No primary bone lesion or focal pathologic process. Soft tissues and spinal  canal: No prevertebral fluid or swelling. No visible canal hematoma. Disc levels: Disc space narrowing with sclerosis and marginal osteophytes consistent with degenerative disc disease C4-C7. Facet joint osteoarthritic changes C3-4 through C7-T1. Upper chest: Biapical pleural/parenchymal changes likely representing scarring. IMPRESSION: 1. Atrophy and chronic small vessel ischemic changes. 2. No acute intracranial process identified. 3. Cervical degenerative changes. 4. No acute traumatic abnormalities of the cervical spine. Electronically  Signed   By: Layla Maw M.D.   On: 01/27/2023 18:22   CT Cervical Spine Wo Contrast  Result Date: 01/27/2023 CLINICAL DATA:  Trauma. EXAM: CT HEAD WITHOUT CONTRAST CT CERVICAL SPINE WITHOUT CONTRAST TECHNIQUE: Multidetector CT imaging of the head and cervical spine was performed following the standard protocol without intravenous contrast. Multiplanar CT image reconstructions of the cervical spine were also generated. RADIATION DOSE REDUCTION: This exam was performed according to the departmental dose-optimization program which includes automated exposure control, adjustment of the mA and/or kV according to patient size and/or use of iterative reconstruction technique. COMPARISON:  Head CT 01/25/2023. FINDINGS: CT HEAD FINDINGS Brain: There is periventricular white matter decreased attenuation consistent with small vessel ischemic changes. Ventricles, sulci and cisterns are prominent consistent with age related involutional changes. No acute intracranial hemorrhage, mass effect or shift. No hydrocephalus. Vascular: No hyperdense vessel or unexpected calcification. Skull: Normal. Negative for fracture or focal lesion. Sinuses/Orbits: No acute finding. CT CERVICAL SPINE FINDINGS Alignment: Normal. Skull base and vertebrae: No acute fracture. No primary bone lesion or focal pathologic process. Soft tissues and spinal canal: No prevertebral fluid or swelling. No visible canal hematoma. Disc levels: Disc space narrowing with sclerosis and marginal osteophytes consistent with degenerative disc disease C4-C7. Facet joint osteoarthritic changes C3-4 through C7-T1. Upper chest: Biapical pleural/parenchymal changes likely representing scarring. IMPRESSION: 1. Atrophy and chronic small vessel ischemic changes. 2. No acute intracranial process identified. 3. Cervical degenerative changes. 4. No acute traumatic abnormalities of the cervical spine. Electronically Signed   By: Layla Maw M.D.   On: 01/27/2023 18:22    DG Femur Min 2 Views Left  Result Date: 01/27/2023 CLINICAL DATA:  Fall, initial encounter.  Left hip and leg pain. EXAM: PELVIS - 1-2 VIEW; LEFT FEMUR 2 VIEWS COMPARISON:  None Available. FINDINGS: Pelvis: Displaced left inferior pubic ramus fracture. There is a mildly comminuted and displaced fracture the left superior ramus abutting the pubic body. Fracture may extend to the pubic symphysis. No definite disruption of sacral ala. Moderate right hip osteoarthritis. Femur: Left hip arthroplasty in expected alignment. There is no periprosthetic lucency or fracture. The distal femur is intact. Knee alignment is maintained. No focal soft tissue abnormalities. IMPRESSION: 1. Displaced left inferior pubic ramus fracture. 2. Mildly comminuted and displaced fracture of the left superior pubic ramus abutting the pubic body. Fracture may extend to the pubic symphysis. 3. No fracture of the left hip or femur. Left hip arthroplasty is intact. Electronically Signed   By: Narda Rutherford M.D.   On: 01/27/2023 17:15   DG Pelvis 1-2 Views  Result Date: 01/27/2023 CLINICAL DATA:  Fall, initial encounter.  Left hip and leg pain. EXAM: PELVIS - 1-2 VIEW; LEFT FEMUR 2 VIEWS COMPARISON:  None Available. FINDINGS: Pelvis: Displaced left inferior pubic ramus fracture. There is a mildly comminuted and displaced fracture the left superior ramus abutting the pubic body. Fracture may extend to the pubic symphysis. No definite disruption of sacral ala. Moderate right hip osteoarthritis. Femur: Left hip arthroplasty in expected alignment. There is no periprosthetic lucency or  fracture. The distal femur is intact. Knee alignment is maintained. No focal soft tissue abnormalities. IMPRESSION: 1. Displaced left inferior pubic ramus fracture. 2. Mildly comminuted and displaced fracture of the left superior pubic ramus abutting the pubic body. Fracture may extend to the pubic symphysis. 3. No fracture of the left hip or femur. Left hip  arthroplasty is intact. Electronically Signed   By: Narda Rutherford M.D.   On: 01/27/2023 17:15    Positive ROS: All other systems have been reviewed and were otherwise negative with the exception of those mentioned in the HPI and as above.  Physical Exam: General:  Alert, no acute distress Psychiatric:  Patient is competent for consent with normal mood and affect   Cardiovascular:  No pedal edema Respiratory:  No wheezing, non-labored breathing GI:  Abdomen is soft and non-tender Skin:  No lesions in the area of chief complaint Neurologic:  Sensation intact distally Lymphatic:  No axillary or cervical lymphadenopathy  Orthopedic Exam:  Left lower extremity Tender to palpation over the pubic symphysis and mild tenderness over the lateral left hip. No tenderness to palpation over the distal femur, knee, tibia, ankle or foot. No pain with gentle logroll of the left leg or simulated axial load Neurovascular intact distally able dorsiflex plantarflex the foot with an intact dorsalis pedis pulse and brisk capillary refill all compartments soft  Secondary survey No tenderness to palpation over other bony prominences in the lower extremities or bilateral upper extremities No pain with logroll or simulated axial loading of the right lower extremity All compartments soft No tenderness to palpation over the cervical or thoracic spine, no bony step-off Motor grossly intact throughout, no focal deficits Sensation grossly intact throughout, no focal deficits Good distal pulses and capillary refill on all extremities    Imaging X-rays and CT scan images and report reviewed by myself.  Patient has a left superior and inferior pubic ramus fracture as well as a right sacral insufficiency fracture consistent with a posterior and anterior pelvic ring injury.  The left total replacement appears in appropriate position without any periprosthetic fracture or loosening.  There are no fracture lines  extending up to the medial acetabulum or around the acetabular component.  Some limitation in exam given positioning of the patient and metal artifact.  No sign of instability of the SI joints bilaterally with closed anterior and posterior sacroiliac articulations.  Agree with radiologist interpretation.  Assessment: Left inferior and superior pubic ramus fractures and right sacral alla fracture Left hip contusion  Plan: I reviewed the clinical and radiological findings with the patient and her husband and daughter.  We reviewed the natural course and history of fracture healing and indications for intervention in fractures around the hips and pelvis.  These fractures appear stable on imaging and the patient would benefit from physical therapy. She may weight-bear as tolerated with assistive devices and therapist.  Did discuss given the number of falls she has had she will likely need some assistance going forward with walking and activities and may need a short course of rehabilitation in an inpatient facility.  Will wait to see how her physical therapy evaluation goes before making decisions on future plans for the patient.  No plan for any orthopedic surgical intervention at this time and we will follow these fractures with serial x-rays in the office recommend follow-up in 2 weeks time for x-rays in the office at [facility] clinic.  All questions answered and the patient and daughter agree with the  above plan for outpatient follow-up.    Reinaldo Berber MD  Beeper #:  [phone]  01/28/2023 12:39 PM

## 2023-01-28 NOTE — Plan of Care (Signed)
  Problem: Activity: Goal: Risk for activity intolerance will decrease Outcome: Progressing   Problem: Nutrition: Goal: Adequate nutrition will be maintained Outcome: Progressing   Problem: Pain Managment: Goal: General experience of comfort will improve Outcome: Progressing   Problem: Safety: Goal: Ability to remain free from injury will improve Outcome: Progressing   

## 2023-01-29 DIAGNOSIS — R296 Repeated falls: Secondary | ICD-10-CM | POA: Diagnosis not present

## 2023-01-29 LAB — BASIC METABOLIC PANEL
Anion gap: 4 — ABNORMAL LOW (ref 5–15)
BUN: 19 mg/dL (ref 8–23)
CO2: 21 mmol/L — ABNORMAL LOW (ref 22–32)
Calcium: 8.1 mg/dL — ABNORMAL LOW (ref 8.9–10.3)
Chloride: 111 mmol/L (ref 98–111)
Creatinine, Ser: 0.73 mg/dL (ref 0.44–1.00)
GFR, Estimated: >60 mL/min (ref >60)
Glucose, Bld: 111 mg/dL — ABNORMAL HIGH (ref 70–99)
Potassium: 3.9 mmol/L (ref 3.5–5.1)
Sodium: 136 mmol/L (ref 135–145)

## 2023-01-29 LAB — CBC WITH DIFFERENTIAL/PLATELET
Abs Immature Granulocytes: 0.05 10*3/uL (ref 0.00–0.07)
Basophils Absolute: 0 10*3/uL (ref 0.0–0.1)
Basophils Relative: 0 %
Eosinophils Absolute: 0.3 10*3/uL (ref 0.0–0.5)
Eosinophils Relative: 3 %
HCT: 27.9 % — ABNORMAL LOW (ref 36.0–46.0)
Hemoglobin: 9.7 g/dL — ABNORMAL LOW (ref 12.0–15.0)
Immature Granulocytes: 1 %
Lymphocytes Relative: 18 %
Lymphs Abs: 1.5 10*3/uL (ref 0.7–4.0)
MCH: 32.1 pg (ref 26.0–34.0)
MCHC: 34.8 g/dL (ref 30.0–36.0)
MCV: 92.4 fL (ref 80.0–100.0)
Monocytes Absolute: 0.7 10*3/uL (ref 0.1–1.0)
Monocytes Relative: 8 %
Neutro Abs: 5.8 10*3/uL (ref 1.7–7.7)
Neutrophils Relative %: 70 %
Platelets: 143 10*3/uL — ABNORMAL LOW (ref 150–400)
RBC: 3.02 MIL/uL — ABNORMAL LOW (ref 3.87–5.11)
RDW: 13.6 % (ref 11.5–15.5)
WBC: 8.3 10*3/uL (ref 4.0–10.5)
nRBC: 0 % (ref 0.0–0.2)

## 2023-01-29 MED ORDER — HYDROCODONE-ACETAMINOPHEN 5-325 MG PO TABS: 1.0000 | ORAL_TABLET | ORAL | Status: DC | PRN

## 2023-01-29 NOTE — Plan of Care (Signed)
  Problem: Activity: Goal: Risk for activity intolerance will decrease Outcome: Progressing   Problem: Nutrition: Goal: Adequate nutrition will be maintained Outcome: Progressing   Problem: Pain Managment: Goal: General experience of comfort will improve Outcome: Progressing   Problem: Safety: Goal: Ability to remain free from injury will improve Outcome: Progressing   

## 2023-01-29 NOTE — Progress Notes (Signed)
PROGRESS NOTE    Caitlyn Burns   UJW:[id number] DOB: [birth year]  DOA: 01/27/2023 Date of Service: 01/29/23 PCP: Lynnea Ferrier, MD     Brief Narrative / Hospital Course:  Caitlyn Burns is a 89 y.o. female with medical history significant for hypertension, dementia, arthritis, history of stroke presenting from home with falls.  Patient has been falling over the past 2 days no reports of striking her head she has a history of dementia  08/19 to ED from home. Imaging showed left inferior pubic rami fracture. Admitted to hospitalist service.  08/20: per ortho, no surgery, WBAT, PT/OT 08/21: PT/OT recs for SNF rehab    Consultants:  Orthopedics  Procedures: None       ASSESSMENT & PLAN:   Principal Problem:   Frequent falls Active Problems:   Fall at home, initial encounter   Inferior pubic ramus fracture, left, closed, initial encounter (HCC)   Benign essential hypertension   CAD (coronary artery disease), native coronary artery   Dementia arising in the senium and presenium (HCC)   Closed fracture of left superior rim of pubis (HCC)   Sacral fracture, closed (HCC)  Inferior pubic ramus fracture, left, closed, initial encounter (HCC) Orthopedics - Dr. Audelia Acton - no surgical intervention is planned Orthopedic surgery recommends weightbearing as tolerated with assistive devices and therapy Outpatient follow-up with orthopedics in 2 weeks for follow-up serial imaging Pain control SNF rehab  Frequent falls Fall at home, initial encounter PT OT  SNF rehab  Fall precaution.   Dementia arising in the senium and presenium (HCC) Will monitor . No reports of agitation or behavioral disturbance in PMH.    CAD (coronary artery disease), native coronary artery Denies any acute chest pains Continue current statin therapy   Benign essential hypertension Continue lopressor.  Prn hydralazine.  Monitor blood pressure closely      DVT prophylaxis: lovenox Pertinent IV  fluids/nutrition: no continuous IV fluids  Central lines / invasive devices: none  Code Status: FULL CODE ACP documentation reviewed: none on file   Current Admission Status: inpatient   TOC needs / Dispo plan: SNF/STR placement Barriers to discharge / significant pending items: placement              Subjective / Brief ROS:  Patient reports tired she had just been oob to chair before I came in Denies CP/SOB.  Pain is only complaint.  Denies new weakness.  Tolerating diet.  Reports no concerns w/ urination/defecation.   Family Communication: family at bedside on rounds     Objective Findings:  Vitals:   01/28/23 2301 01/29/23 0612 01/29/23 0612 01/29/23 0900  BP: (!) 140/63 137/67  111/75  Pulse: 88 94 93   Resp:  20  20  Temp: 98.4 F (36.9 C)  98.5 F (36.9 C) 97.9 F (36.6 C)  TempSrc: Axillary   Oral  SpO2: 98% 98% 97% 98%  Weight:      Height:        Intake/Output Summary (Last 24 hours) at 01/29/2023 1532 Last data filed at 01/29/2023 1504 Gross per 24 hour  Intake 1974.52 ml  Output --  Net 1974.52 ml   Filed Weights   01/27/23 1626  Weight: 49.4 kg    Examination:  Physical Exam Constitutional:      General: She is not in acute distress. Cardiovascular:     Rate and Rhythm: Tachycardia present. Rhythm irregular.     Heart sounds: Murmur heard.  Pulmonary:  Effort: Pulmonary effort is normal.     Breath sounds: Normal breath sounds.  Musculoskeletal:     Right lower leg: No edema.     Left lower leg: No edema.  Neurological:     General: No focal deficit present.     Mental Status: She is alert. Mental status is at baseline.  Psychiatric:        Mood and Affect: Mood normal.          Scheduled Medications:   docusate sodium  100 mg Oral BID   enoxaparin (LOVENOX) injection  40 mg Subcutaneous Q24H   pravastatin  20 mg Oral QHS    Continuous Infusions:  lactated ringers 50 mL/hr at 01/28/23 1723   methocarbamol  (ROBAXIN) IV      PRN Medications:  bisacodyl, HYDROcodone-acetaminophen, methocarbamol **OR** methocarbamol (ROBAXIN) IV, polyethylene glycol  Antimicrobials from admission:  Anti-infectives (From admission, onward)    None           Data Reviewed:  I have personally reviewed the following...  CBC: Recent Labs  Lab 01/25/23 1159 01/27/23 1822 01/28/23 0421 01/29/23 0326  WBC 6.6 11.6* 9.8 8.3  NEUTROABS 4.8  --   --  5.8  HGB 12.2 11.7* 10.4* 9.7*  HCT 37.1 34.4* 30.3* 27.9*  MCV 96.9 94.2 92.7 92.4  PLT 172 152 140* 143*   Basic Metabolic Panel: Recent Labs  Lab 01/25/23 1159 01/27/23 1822 01/28/23 0421 01/29/23 0326  NA 138 139 140 136  K 4.2 3.9 3.9 3.9  CL 109 107 111 111  CO2 24 24 22  21*  GLUCOSE 93 168* 122* 111*  BUN 14 24* 23 19  CREATININE 0.85 0.90 0.76 0.73  CALCIUM 8.5* 9.7 8.7* 8.1*   GFR: Estimated Creatinine Clearance: 36.4 mL/min (by C-G formula based on SCr of 0.73 mg/dL). Liver Function Tests: No results for input(s): "AST", "ALT", "ALKPHOS", "BILITOT", "PROT", "ALBUMIN" in the last 168 hours. No results for input(s): "LIPASE", "AMYLASE" in the last 168 hours. No results for input(s): "AMMONIA" in the last 168 hours. Coagulation Profile: Recent Labs  Lab 01/27/23 1822  INR 1.2   Cardiac Enzymes: No results for input(s): "CKTOTAL", "CKMB", "CKMBINDEX", "TROPONINI" in the last 168 hours. BNP (last 3 results) No results for input(s): "PROBNP" in the last 8760 hours. HbA1C: No results for input(s): "HGBA1C" in the last 72 hours. CBG: No results for input(s): "GLUCAP" in the last 168 hours. Lipid Profile: No results for input(s): "CHOL", "HDL", "LDLCALC", "TRIG", "CHOLHDL", "LDLDIRECT" in the last 72 hours. Thyroid Function Tests: No results for input(s): "TSH", "T4TOTAL", "FREET4", "T3FREE", "THYROIDAB" in the last 72 hours. Anemia Panel: No results for input(s): "VITAMINB12", "FOLATE", "FERRITIN", "TIBC", "IRON",  "RETICCTPCT" in the last 72 hours. Most Recent Urinalysis On File:     Component Value Date/Time   COLORURINE YELLOW (A) 01/25/2023 1301   APPEARANCEUR HAZY (A) 01/25/2023 1301   LABSPEC 1.026 01/25/2023 1301   PHURINE 5.0 01/25/2023 1301   GLUCOSEU NEGATIVE 01/25/2023 1301   HGBUR NEGATIVE 01/25/2023 1301   BILIRUBINUR NEGATIVE 01/25/2023 1301   KETONESUR 5 (A) 01/25/2023 1301   PROTEINUR NEGATIVE 01/25/2023 1301   NITRITE NEGATIVE 01/25/2023 1301   LEUKOCYTESUR NEGATIVE 01/25/2023 1301   Sepsis Labs: @LABRCNTIP (procalcitonin:4,lacticidven:4) Microbiology: No results found for this or any previous visit (from the past 240 hour(s)).    Radiology Studies last 3 days: CT PELVIS WO CONTRAST  Result Date: 01/27/2023 CLINICAL DATA:  89 year old female with dementia who presents after fall. Patient has  fallen multiple times over the last few days. Evaluate fracture seen on radiograph 01/27/2023 EXAM: CT PELVIS WITHOUT CONTRAST TECHNIQUE: Multidetector CT imaging of the pelvis was performed following the standard protocol without intravenous contrast. RADIATION DOSE REDUCTION: This exam was performed according to the departmental dose-optimization program which includes automated exposure control, adjustment of the mA and/or kV according to patient size and/or use of iterative reconstruction technique. COMPARISON:  Radiograph 01/27/2023 FINDINGS: Urinary Tract:  No abnormality visualized. Bowel:  Unremarkable visualized pelvic bowel loops. Vascular/Lymphatic: Arterial atherosclerotic calcification. No lymphadenopathy. Reproductive:  No acute abnormality. Other:  Small amount of blood in the pelvis. Musculoskeletal: Acute comminuted fracture of the left superior pubic ramus extending into the pubic body. Additional displaced fracture of the left inferior pubic ramus. Mildly displaced right sacral ala fractures. Left hip hemiarthroplasty. IMPRESSION: 1. Acute comminuted fracture of the left superior  pubic ramus extending into the pubic body. 2. Mildly displaced fracture of the left inferior pubic ramus. 3. Mildly displaced right sacral ala fractures. 4. Small volume extraperitoneal blood in the pelvis. Electronically Signed   By: Minerva Fester M.D.   On: 01/27/2023 20:31   CT Head Wo Contrast  Result Date: 01/27/2023 CLINICAL DATA:  Trauma. EXAM: CT HEAD WITHOUT CONTRAST CT CERVICAL SPINE WITHOUT CONTRAST TECHNIQUE: Multidetector CT imaging of the head and cervical spine was performed following the standard protocol without intravenous contrast. Multiplanar CT image reconstructions of the cervical spine were also generated. RADIATION DOSE REDUCTION: This exam was performed according to the departmental dose-optimization program which includes automated exposure control, adjustment of the mA and/or kV according to patient size and/or use of iterative reconstruction technique. COMPARISON:  Head CT 01/25/2023. FINDINGS: CT HEAD FINDINGS Brain: There is periventricular white matter decreased attenuation consistent with small vessel ischemic changes. Ventricles, sulci and cisterns are prominent consistent with age related involutional changes. No acute intracranial hemorrhage, mass effect or shift. No hydrocephalus. Vascular: No hyperdense vessel or unexpected calcification. Skull: Normal. Negative for fracture or focal lesion. Sinuses/Orbits: No acute finding. CT CERVICAL SPINE FINDINGS Alignment: Normal. Skull base and vertebrae: No acute fracture. No primary bone lesion or focal pathologic process. Soft tissues and spinal canal: No prevertebral fluid or swelling. No visible canal hematoma. Disc levels: Disc space narrowing with sclerosis and marginal osteophytes consistent with degenerative disc disease C4-C7. Facet joint osteoarthritic changes C3-4 through C7-T1. Upper chest: Biapical pleural/parenchymal changes likely representing scarring. IMPRESSION: 1. Atrophy and chronic small vessel ischemic changes.  2. No acute intracranial process identified. 3. Cervical degenerative changes. 4. No acute traumatic abnormalities of the cervical spine. Electronically Signed   By: Layla Maw M.D.   On: 01/27/2023 18:22   CT Cervical Spine Wo Contrast  Result Date: 01/27/2023 CLINICAL DATA:  Trauma. EXAM: CT HEAD WITHOUT CONTRAST CT CERVICAL SPINE WITHOUT CONTRAST TECHNIQUE: Multidetector CT imaging of the head and cervical spine was performed following the standard protocol without intravenous contrast. Multiplanar CT image reconstructions of the cervical spine were also generated. RADIATION DOSE REDUCTION: This exam was performed according to the departmental dose-optimization program which includes automated exposure control, adjustment of the mA and/or kV according to patient size and/or use of iterative reconstruction technique. COMPARISON:  Head CT 01/25/2023. FINDINGS: CT HEAD FINDINGS Brain: There is periventricular white matter decreased attenuation consistent with small vessel ischemic changes. Ventricles, sulci and cisterns are prominent consistent with age related involutional changes. No acute intracranial hemorrhage, mass effect or shift. No hydrocephalus. Vascular: No hyperdense vessel or unexpected calcification. Skull: Normal.  Negative for fracture or focal lesion. Sinuses/Orbits: No acute finding. CT CERVICAL SPINE FINDINGS Alignment: Normal. Skull base and vertebrae: No acute fracture. No primary bone lesion or focal pathologic process. Soft tissues and spinal canal: No prevertebral fluid or swelling. No visible canal hematoma. Disc levels: Disc space narrowing with sclerosis and marginal osteophytes consistent with degenerative disc disease C4-C7. Facet joint osteoarthritic changes C3-4 through C7-T1. Upper chest: Biapical pleural/parenchymal changes likely representing scarring. IMPRESSION: 1. Atrophy and chronic small vessel ischemic changes. 2. No acute intracranial process identified. 3. Cervical  degenerative changes. 4. No acute traumatic abnormalities of the cervical spine. Electronically Signed   By: Layla Maw M.D.   On: 01/27/2023 18:22   DG Femur Min 2 Views Left  Result Date: 01/27/2023 CLINICAL DATA:  Fall, initial encounter.  Left hip and leg pain. EXAM: PELVIS - 1-2 VIEW; LEFT FEMUR 2 VIEWS COMPARISON:  None Available. FINDINGS: Pelvis: Displaced left inferior pubic ramus fracture. There is a mildly comminuted and displaced fracture the left superior ramus abutting the pubic body. Fracture may extend to the pubic symphysis. No definite disruption of sacral ala. Moderate right hip osteoarthritis. Femur: Left hip arthroplasty in expected alignment. There is no periprosthetic lucency or fracture. The distal femur is intact. Knee alignment is maintained. No focal soft tissue abnormalities. IMPRESSION: 1. Displaced left inferior pubic ramus fracture. 2. Mildly comminuted and displaced fracture of the left superior pubic ramus abutting the pubic body. Fracture may extend to the pubic symphysis. 3. No fracture of the left hip or femur. Left hip arthroplasty is intact. Electronically Signed   By: Narda Rutherford M.D.   On: 01/27/2023 17:15   DG Pelvis 1-2 Views  Result Date: 01/27/2023 CLINICAL DATA:  Fall, initial encounter.  Left hip and leg pain. EXAM: PELVIS - 1-2 VIEW; LEFT FEMUR 2 VIEWS COMPARISON:  None Available. FINDINGS: Pelvis: Displaced left inferior pubic ramus fracture. There is a mildly comminuted and displaced fracture the left superior ramus abutting the pubic body. Fracture may extend to the pubic symphysis. No definite disruption of sacral ala. Moderate right hip osteoarthritis. Femur: Left hip arthroplasty in expected alignment. There is no periprosthetic lucency or fracture. The distal femur is intact. Knee alignment is maintained. No focal soft tissue abnormalities. IMPRESSION: 1. Displaced left inferior pubic ramus fracture. 2. Mildly comminuted and displaced fracture  of the left superior pubic ramus abutting the pubic body. Fracture may extend to the pubic symphysis. 3. No fracture of the left hip or femur. Left hip arthroplasty is intact. Electronically Signed   By: Narda Rutherford M.D.   On: 01/27/2023 17:15             LOS: 2 days      Sunnie Nielsen, DO [facility] 01/29/2023, 3:32 PM    Dictation software may have been used to generate the above note. Typos may occur and escape review in typed/dictated notes. Please contact Dr Lyn Hollingshead directly for clarity if needed.  Staff may message me via secure chat in Epic  but this may not receive an immediate response,  please page me for urgent matters!  If 7PM-7AM, please contact night coverage [email or web address]

## 2023-01-29 NOTE — Evaluation (Signed)
Occupational Therapy Evaluation Patient Details Name: Caitlyn Burns MRN: [id number] DOB: 28-May-1931 Today's Date: 01/29/2023   History of Present Illness Caitlyn Burns is a 89 y.o. female with medical history significant for hypertension, dementia, arthritis, history of stroke presenting from home with falls. Imaging revealing of a left superior and inferior pubic ramus fracture as well as a right sacral insufficiency fracture consistent with a posterior and anterior pelvic ring injury per ortho plan for WBAT with AD use.   Clinical Impression   Ms Wnek was seen for OT evaluation this date. Prior to hospital admission, pt was IND for mobility prior to recent fall, since fall requiring near dependent assist. Pt lives in 2 level home, lives on main level, with spouse who is 30 yo. Pt currently requires MAX A bed mobility. MIN A self feeding seated EOB, assist for sitting balance and cues to locate food items on plate. MAX A x2 + RW bed>chair step pivot t/f - assist to advance RLE and weight shift to LLE. Educated family on OT role and plan of care. Pt would benefit from skilled OT to address noted impairments and functional limitations (see below for any additional details). Upon hospital discharge, recommend +2 assist for safe mobility and follow up OT services.    If plan is discharge home, recommend the following: Two people to help with walking and/or transfers;Two people to help with bathing/dressing/bathroom;Supervision due to cognitive status;Help with stairs or ramp for entrance    Functional Status Assessment  Patient has had a recent decline in their functional status and demonstrates the ability to make significant improvements in function in a reasonable and predictable amount of time.  Equipment Recommendations  BSC/3in1;Hospital bed    Recommendations for Other Services       Precautions / Restrictions Precautions Precautions: Fall Restrictions Weight Bearing Restrictions:  Yes LLE Weight Bearing: Weight bearing as tolerated      Mobility Bed Mobility Overal bed mobility: Needs Assistance Bed Mobility: Supine to Sit     Supine to sit: Max assist          Transfers Overall transfer level: Needs assistance Equipment used: Rolling walker (2 wheels) Transfers: Sit to/from Stand, Bed to chair/wheelchair/BSC Sit to Stand: Mod assist, +2 physical assistance     Step pivot transfers: Max assist, +2 physical assistance            Balance Overall balance assessment: Needs assistance Sitting-balance support: Feet supported, Bilateral upper extremity supported Sitting balance-Leahy Scale: Fair Sitting balance - Comments: MIN A with single UE support   Standing balance support: Bilateral upper extremity supported, Reliant on assistive device for balance Standing balance-Leahy Scale: Poor                             ADL either performed or assessed with clinical judgement   ADL Overall ADL's : Needs assistance/impaired                                       General ADL Comments: MIN A self feeding seated EOB, assist for sitting balance and cues to locate food items on plate. MAX A don B socks seated EOB. MAX A x2 + RW simulated BSC t/f      Pertinent Vitals/Pain Pain Assessment Pain Assessment: PAINAD Breathing: normal Negative Vocalization: occasional moan/groan, low speech, negative/disapproving quality Facial Expression: smiling or  inexpressive Body Language: tense, distressed pacing, fidgeting Consolability: distracted or reassured by voice/touch PAINAD Score: 3 Pain Location: L hip Pain Descriptors / Indicators: Discomfort, Grimacing, Guarding Pain Intervention(s): Limited activity within patient's tolerance, Repositioned     Extremity/Trunk Assessment Upper Extremity Assessment Upper Extremity Assessment: Generalized weakness   Lower Extremity Assessment Lower Extremity Assessment: Generalized  weakness       Communication Communication Communication: No apparent difficulties Cueing Techniques: Verbal cues;Tactile cues   Cognition Arousal: Alert Behavior During Therapy: WFL for tasks assessed/performed Overall Cognitive Status: History of cognitive impairments - at baseline                                                  Home Living Family/patient expects to be discharged to:: Private residence Living Arrangements: Spouse/significant other Available Help at Discharge: Family;Available 24 hours/day Type of Home: House Home Access: Stairs to enter [employer] of Steps: 3+1 Entrance Stairs-Rails: Right;Left Home Layout: Two level;Able to live on main level with bedroom/bathroom     Bathroom Shower/Tub: Walk-in shower             Additional Comments: son lives next door, son and daughter assist with meals.      Prior Functioning/Environment Prior Level of Function : Independent/Modified Independent             Mobility Comments: no AD use prior to recent falls, 1 week of using RW or dependent transfers following fall ADLs Comments: assist for IADLs        OT Problem List: Decreased strength;Decreased activity tolerance;Decreased range of motion;Impaired balance (sitting and/or standing);Decreased safety awareness      OT Treatment/Interventions: Self-care/ADL training;Therapeutic exercise;Energy conservation;DME and/or AE instruction;Therapeutic activities;Balance training;Patient/family education    OT Goals(Current goals can be found in the care plan section) Acute Rehab OT Goals Patient Stated Goal: to return home when safe to do so OT Goal Formulation: With family Time For Goal Achievement: 02/12/23 Potential to Achieve Goals: Good ADL Goals Pt Will Perform Grooming: with modified independence;sitting Pt Will Perform Lower Body Dressing: with min assist;sit to/from stand Pt Will Transfer to Toilet: with min  assist;bedside commode;ambulating Pt Will Perform Toileting - Clothing Manipulation and hygiene: with min assist;sit to/from stand  OT Frequency: Min 1X/week    Co-evaluation              AM-PAC OT "6 Clicks" Daily Activity     Outcome Measure Help from another person eating meals?: A Little Help from another person taking care of personal grooming?: A Little Help from another person toileting, which includes using toliet, bedpan, or urinal?: A Lot Help from another person bathing (including washing, rinsing, drying)?: A Lot Help from another person to put on and taking off regular upper body clothing?: A Little Help from another person to put on and taking off regular lower body clothing?: A Lot 6 Click Score: 15   End of Session Equipment Utilized During Treatment: Rolling walker (2 wheels);Gait belt Nurse Communication: Mobility status  Activity Tolerance: Patient tolerated treatment well Patient left: in chair;with call bell/phone within reach;with chair alarm set;with family/visitor present  OT Visit Diagnosis: Other abnormalities of gait and mobility (R26.89);Muscle weakness (generalized) (M62.81)                Time: 1610-9604 OT Time Calculation (min): 27 min Charges:  OT General  Charges $OT Visit: 1 Visit OT Evaluation $OT Eval Moderate Complexity: 1 Mod OT Treatments $Self Care/Home Management : 8-22 mins  Kathie Dike, M.S. OTR/L  01/29/23, 10:10 AM  ascom [phone]

## 2023-01-29 NOTE — Progress Notes (Signed)
Physical Therapy Treatment Patient Details Name: Caitlyn Burns MRN: [id number] DOB: 31-Jan-1931 Today's Date: 01/29/2023   History of Present Illness Pt is a 89 y.o. female with medical history significant for hypertension, dementia, arthritis, history of stroke presenting from home with falls. Imaging revealing of a left superior and inferior pubic ramus fracture as well as a right sacral insufficiency fracture consistent with a posterior and anterior pelvic ring injury per ortho plan for WBAT with AD use.    PT Comments  Pt steadily progressing towards PT goals. Pt is received in recliner with daughter at bedside, she is sleepy but agreeable to PT session. Pt performs bed mobility and stand pivot transfer (recliner>bed) max A. No reports of increase L hip pain/discomfort during PT session at this time. Pt cont to demonstrate FAB during mobility requiring frequent cuing and reinforcement. Educated Pt on benefits of standing bouts-Pt verbalized understanding. Pt would benefit from cont skilled PT to address above deficits and promote optimal return to PLOF.     If plan is discharge home, recommend the following: A lot of help with walking and/or transfers;A lot of help with bathing/dressing/bathroom;Assistance with cooking/housework;Assistance with feeding;Assist for transportation;Supervision due to cognitive status;Help with stairs or ramp for entrance   Can travel by private vehicle     No  Equipment Recommendations  Other (comment)    Recommendations for Other Services       Precautions / Restrictions Precautions Precautions: Fall Restrictions Weight Bearing Restrictions: Yes LLE Weight Bearing: Weight bearing as tolerated     Mobility  Bed Mobility Overal bed mobility: Needs Assistance Bed Mobility: Sit to Supine       Sit to supine: Max assist   General bed mobility comments: Able to initiate bed mobility but required max A for completion of task    Transfers Overall  transfer level: Needs assistance Equipment used: None Transfers: Bed to chair/wheelchair/BSC Sit to Stand: Mod assist, Max assist Stand pivot transfers: Max assist         General transfer comment: cuing for weight shifting fw to facilitate transfer    Ambulation/Gait               General Gait Details: NT due to fatigue   Stairs             Wheelchair Mobility     Tilt Bed    Modified Rankin (Stroke Patients Only)       Balance Overall balance assessment: Needs assistance Sitting-balance support: Feet supported, Bilateral upper extremity supported Sitting balance-Leahy Scale: Fair Sitting balance - Comments: min A with 1 UE support Postural control: Posterior lean Standing balance support: Bilateral upper extremity supported, Reliant on assistive device for balance, During functional activity Standing balance-Leahy Scale: Poor Standing balance comment: Not performed due to fatigue                            Cognition Arousal: Lethargic Behavior During Therapy: WFL for tasks assessed/performed Overall Cognitive Status: History of cognitive impairments - at baseline                                 General Comments: Plesant but very tired and fearful to move, although cooperative        Exercises Other Exercises Other Exercises: 2x30 seconds standing static balance using RW mod A    General Comments  Pertinent Vitals/Pain Pain Assessment Pain Assessment: No/denies pain Pain Score: 2  Negative Vocalization: occasional moan/groan, low speech, negative/disapproving quality Body Language: tense, distressed pacing, fidgeting Consolability: distracted or reassured by voice/touch Pain Location: L hip Pain Descriptors / Indicators: Discomfort, Grimacing, Guarding Pain Intervention(s): Monitored during session    Home Living Family/patient expects to be discharged to:: Private residence Living Arrangements:  Spouse/significant other Available Help at Discharge: Family;Available 24 hours/day Type of Home: House Home Access: Stairs to enter Entrance Stairs-Rails: Doctor, general practice of Steps: 3+1   Home Layout: Two level;Able to live on main level with bedroom/bathroom Home Equipment: Rolling Walker (2 wheels) Additional Comments: son lives next door, son and daughter assist with meals.    Prior Function            PT Goals (current goals can now be found in the care plan section) Acute Rehab PT Goals Patient Stated Goal: to get better PT Goal Formulation: With patient Time For Goal Achievement: 02/12/23 Potential to Achieve Goals: Fair Progress towards PT goals: Progressing toward goals    Frequency    Min 1X/week      PT Plan      Co-evaluation              AM-PAC PT "6 Clicks" Mobility   Outcome Measure  Help needed turning from your back to your side while in a flat bed without using bedrails?: A Lot Help needed moving from lying on your back to sitting on the side of a flat bed without using bedrails?: A Lot Help needed moving to and from a bed to a chair (including a wheelchair)?: A Lot Help needed standing up from a chair using your arms (e.g., wheelchair or bedside chair)?: A Lot Help needed to walk in hospital room?: A Lot Help needed climbing 3-5 steps with a railing? : Total 6 Click Score: 11    End of Session Equipment Utilized During Treatment: Gait belt Activity Tolerance: Patient limited by fatigue Patient left: in chair;with call bell/phone within reach;with bed alarm set;with family/visitor present Nurse Communication: Mobility status PT Visit Diagnosis: Unsteadiness on feet (R26.81);Repeated falls (R29.6);History of falling (Z91.81);Muscle weakness (generalized) (M62.81);Pain Pain - Right/Left: Left Pain - part of body: Hip     Time: 1435-1446 PT Time Calculation (min) (ACUTE ONLY): 11 min  Charges:    $Therapeutic  Activity: 8-22 mins PT General Charges $$ ACUTE PT VISIT: 1 Visit                     Elmon Else, SPT   Simrah Chatham 01/29/2023, 4:55 PM

## 2023-01-29 NOTE — Evaluation (Signed)
Physical Therapy Evaluation Patient Details Name: Caitlyn Burns MRN: [id number] DOB: March 11, 1931 Today's Date: 01/29/2023  History of Present Illness  Pt is a 89 y.o. female with medical history significant for hypertension, dementia, arthritis, history of stroke presenting from home with falls. Imaging revealing of a left superior and inferior pubic ramus fracture as well as a right sacral insufficiency fracture consistent with a posterior and anterior pelvic ring injury per ortho plan for WBAT with AD use.  Clinical Impression   Pt is received in recliner, she is agreeable to PT session. Pt reports 2/10 NPS at the beginning of session. Pt performs transfers from recliner mod-max A; unable to assess bed mobility or amb at this time due to pain and decrease activity tolerance. Cuing required for forward lean during STS reps and tucking hips underneath her. Pt able to perform 2x30 seconds standing balance using RW with min reports of discomfort/pain. Pt demonstrates heavy posterior lean and resistance during standing bouts due to fear. Educated Pt and family at bedside the importance behind practicing and increasing standing tolerance to promote safety-Pt and family verbalized understanding. No reports of increase pain at the end of PT session and RN at bedside to provide Pt with muscle relaxant meds. Pt would benefit from skilled PT to address above deficits and promote optimal return to PLOF.        If plan is discharge home, recommend the following: A lot of help with walking and/or transfers;A lot of help with bathing/dressing/bathroom;Assistance with cooking/housework;Assistance with feeding;Assist for transportation;Supervision due to cognitive status;Help with stairs or ramp for entrance   Can travel by private vehicle   No    Equipment Recommendations Other (comment) (TBD at this time)  Recommendations for Other Services       Functional Status Assessment Patient has had a recent decline  in their functional status and demonstrates the ability to make significant improvements in function in a reasonable and predictable amount of time.     Precautions / Restrictions Precautions Precautions: Fall Restrictions Weight Bearing Restrictions: Yes LLE Weight Bearing: Weight bearing as tolerated      Mobility  Bed Mobility               General bed mobility comments: NT due to Pt in recliner at beginning of session    Transfers Overall transfer level: Needs assistance Equipment used: Rolling walker (2 wheels) Transfers: Sit to/from Stand Sit to Stand: Mod assist, Max assist           General transfer comment: First STS required max A with frequent cuing to shift BW forward and hand placement on RW. Second STS able to progress to mod A but cont to require cuing to promote upright posture    Ambulation/Gait               General Gait Details: NT due to pain  Stairs            Wheelchair Mobility     Tilt Bed    Modified Rankin (Stroke Patients Only)       Balance Overall balance assessment: Needs assistance Sitting-balance support: Feet supported, Bilateral upper extremity supported Sitting balance-Leahy Scale: Fair Sitting balance - Comments: min A with 1 UE support   Standing balance support: Bilateral upper extremity supported, Reliant on assistive device for balance, During functional activity Standing balance-Leahy Scale: Poor Standing balance comment: Resistive posterior lean during standing activities with frequent cuing to shift hips underneath her  Pertinent Vitals/Pain Pain Assessment Pain Assessment: 0-10 Pain Score: 2  Negative Vocalization: occasional moan/groan, low speech, negative/disapproving quality Body Language: tense, distressed pacing, fidgeting Consolability: distracted or reassured by voice/touch Pain Location: L hip Pain Descriptors / Indicators: Discomfort,  Grimacing, Guarding Pain Intervention(s): Monitored during session    Home Living Family/patient expects to be discharged to:: Private residence Living Arrangements: Spouse/significant other Available Help at Discharge: Family;Available 24 hours/day Type of Home: House Home Access: Stairs to enter Entrance Stairs-Rails: Doctor, general practice of Steps: 3+1   Home Layout: Two level;Able to live on main level with bedroom/bathroom Home Equipment: Rolling Walker (2 wheels) Additional Comments: son lives next door, son and daughter assist with meals.    Prior Function Prior Level of Function : Independent/Modified Independent             Mobility Comments: no AD use prior to recent falls, 1 week of using RW or dependent transfers following fall ADLs Comments: assist for IADLs     Extremity/Trunk Assessment   Upper Extremity Assessment Upper Extremity Assessment: Defer to OT evaluation    Lower Extremity Assessment Lower Extremity Assessment: Generalized weakness       Communication   Communication Communication: No apparent difficulties Cueing Techniques: Verbal cues;Tactile cues  Cognition Arousal: Alert Behavior During Therapy: WFL for tasks assessed/performed Overall Cognitive Status: History of cognitive impairments - at baseline                                 General Comments: Pleasant but fearful to move, although cooperative        General Comments      Exercises Other Exercises Other Exercises: 2x30 seconds standing static balance using RW mod A   Assessment/Plan    PT Assessment Patient needs continued PT services  PT Problem List Decreased strength;Decreased mobility;Decreased range of motion;Decreased activity tolerance;Decreased balance;Decreased cognition;Decreased knowledge of use of DME;Decreased safety awareness;Pain       PT Treatment Interventions DME instruction;Gait training;Stair training;Functional mobility  training;Therapeutic activities;Balance training;Therapeutic exercise    PT Goals (Current goals can be found in the Care Plan section)  Acute Rehab PT Goals Patient Stated Goal: to get better PT Goal Formulation: With patient Time For Goal Achievement: 02/12/23 Potential to Achieve Goals: Fair    Frequency Min 1X/week     Co-evaluation               AM-PAC PT "6 Clicks" Mobility  Outcome Measure Help needed turning from your back to your side while in a flat bed without using bedrails?: A Lot Help needed moving from lying on your back to sitting on the side of a flat bed without using bedrails?: A Lot Help needed moving to and from a bed to a chair (including a wheelchair)?: A Lot Help needed standing up from a chair using your arms (e.g., wheelchair or bedside chair)?: A Lot Help needed to walk in hospital room?: A Lot Help needed climbing 3-5 steps with a railing? : Total 6 Click Score: 11    End of Session Equipment Utilized During Treatment: Gait belt Activity Tolerance: Patient limited by pain Patient left: in chair;with call bell/phone within reach;with chair alarm set;with nursing/sitter in room Nurse Communication: Mobility status PT Visit Diagnosis: Unsteadiness on feet (R26.81);Repeated falls (R29.6);History of falling (Z91.81);Muscle weakness (generalized) (M62.81);Pain Pain - Right/Left: Left Pain - part of body: Hip    Time: 1610-9604 PT Time Calculation (min) (  ACUTE ONLY): 20 min   Charges:                 Elmon Else, SPT   Chrystel Barefield 01/29/2023, 1:31 PM

## 2023-01-30 DIAGNOSIS — R296 Repeated falls: Secondary | ICD-10-CM | POA: Diagnosis not present

## 2023-01-30 LAB — IRON AND TIBC
Iron: 56 ug/dL (ref 28–170)
Saturation Ratios: 28 % (ref 10.4–31.8)
TIBC: 203 ug/dL — ABNORMAL LOW (ref 250–450)
UIBC: 147 ug/dL

## 2023-01-30 LAB — HEMOGLOBIN AND HEMATOCRIT, BLOOD
HCT: 27.1 % — ABNORMAL LOW (ref 36.0–46.0)
HCT: 30.8 % — ABNORMAL LOW (ref 36.0–46.0)
Hemoglobin: 9.3 g/dL — ABNORMAL LOW (ref 12.0–15.0)
Hemoglobin: 10.3 g/dL — ABNORMAL LOW (ref 12.0–15.0)

## 2023-01-30 LAB — FERRITIN: Ferritin: 372 ng/mL — ABNORMAL HIGH (ref 11–307)

## 2023-01-30 MED ORDER — DONEPEZIL HCL 5 MG PO TABS
5.0000 mg | ORAL_TABLET | Freq: Every day | ORAL | Status: DC
  Administered 2023-01-30: 5 mg via ORAL
  Filled 2023-01-30 (×2): qty 1

## 2023-01-30 MED ORDER — CLOPIDOGREL BISULFATE 75 MG PO TABS
75.0000 mg | ORAL_TABLET | Freq: Every day | ORAL | Status: DC
  Administered 2023-01-30 – 2023-01-31 (×2): 75 mg via ORAL
  Filled 2023-01-30 (×2): qty 1

## 2023-01-30 NOTE — Progress Notes (Signed)
Physical Therapy Treatment Patient Details Name: Caitlyn Burns MRN: [id number] DOB: January 13, 1931 Today's Date: 01/30/2023   History of Present Illness Pt is a 89 y.o. female with medical history significant for hypertension, dementia, arthritis, history of stroke presenting from home with falls. Imaging revealing of a left superior and inferior pubic ramus fracture as well as a right sacral insufficiency fracture consistent with a posterior and anterior pelvic ring injury per ortho plan for WBAT with AD use.    PT Comments  Patient progressing towards physical therapy goals. Seen in conjunction to attempt progression and for patient comfort/safety. Patient agreeable to therapy session but fearful of pain. Required maxA+2 for bed mobility and sit to stand from EOB and recliner surface. Patient with posterior lean in standing with frequent cues for anterior weight shift with poor follow through. Able to take steps towards recliner this date with modA+2 and RW. Easily fatigued. Pleasant and cooperative throughout session. Discharge plan remains appropriate.     If plan is discharge home, recommend the following: A lot of help with walking and/or transfers;A lot of help with bathing/dressing/bathroom;Assistance with cooking/housework;Assistance with feeding;Assist for transportation;Supervision due to cognitive status;Help with stairs or ramp for entrance   Can travel by private vehicle     No  Equipment Recommendations  Other (comment)    Recommendations for Other Services       Precautions / Restrictions Precautions Precautions: Fall Restrictions Weight Bearing Restrictions: Yes LLE Weight Bearing: Weight bearing as tolerated     Mobility  Bed Mobility Overal bed mobility: Needs Assistance Bed Mobility: Supine to Sit     Supine to sit: Mod assist, +2 for physical assistance          Transfers Overall transfer level: Needs assistance Equipment used: Rolling Ramil [place] (2  wheels) Transfers: Sit to/from Stand, Bed to chair/wheelchair/BSC Sit to Stand: Mod assist, Max assist, +2 safety/equipment   Step pivot transfers: Mod assist, +2 physical assistance, +2 safety/equipment            Ambulation/Gait                   Stairs             Wheelchair Mobility     Tilt Bed    Modified Rankin (Stroke Patients Only)       Balance Overall balance assessment: Needs assistance Sitting-balance support: Feet supported, No upper extremity supported Sitting balance-Leahy Scale: Fair                                      Cognition Arousal: Alert Behavior During Therapy: WFL for tasks assessed/performed Overall Cognitive Status: History of cognitive impairments - at baseline                                          Exercises      General Comments        Pertinent Vitals/Pain Pain Assessment Pain Assessment: Faces Faces Pain Scale: Hurts little more Pain Location: L hip Pain Descriptors / Indicators: Discomfort, Grimacing, Guarding Pain Intervention(s): Monitored during session, Repositioned    Home Living                          Prior Function  PT Goals (current goals can now be found in the care plan section) Acute Rehab PT Goals Patient Stated Goal: to get better PT Goal Formulation: With patient Time For Goal Achievement: 02/12/23 Potential to Achieve Goals: Fair Progress towards PT goals: Progressing toward goals    Frequency    Min 1X/week      PT Plan      Co-evaluation              AM-PAC PT "6 Clicks" Mobility   Outcome Measure  Help needed turning from your back to your side while in a flat bed without using bedrails?: A Lot Help needed moving from lying on your back to sitting on the side of a flat bed without using bedrails?: A Lot Help needed moving to and from a bed to a chair (including a wheelchair)?: A Lot Help needed standing  up from a chair using your arms (e.g., wheelchair or bedside chair)?: A Lot Help needed to walk in hospital room?: A Lot Help needed climbing 3-5 steps with a railing? : Total 6 Click Score: 11    End of Session   Activity Tolerance: Patient limited by fatigue;Patient limited by pain Patient left: in chair;with call bell/phone within reach;with chair alarm set;with family/visitor present Nurse Communication: Mobility status PT Visit Diagnosis: Unsteadiness on feet (R26.81);Repeated falls (R29.6);History of falling (Z91.81);Muscle weakness (generalized) (M62.81);Pain Pain - Right/Left: Left Pain - part of body: Hip     Time: 1037-1101 PT Time Calculation (min) (ACUTE ONLY): 24 min  Charges:    $Therapeutic Activity: 8-22 mins PT General Charges $$ ACUTE PT VISIT: 1 Visit                     Maylon Peppers, PT, DPT Physical Therapist - [facility]  [facility]    [name] 01/30/2023, 12:44 PM

## 2023-01-30 NOTE — TOC Progression Note (Signed)
Transition of Care [facility]) - Progression Note    Patient Details  Name: Caitlyn Burns MRN: [id number] Date of Birth: 1931-02-03  [facility] CM/SW Contact  Garret Reddish, RN Phone Number: 01/30/2023, 9:18 AM  Clinical Narrative:   Chart reviewed.  Noted that patient was admitted for frequent falls at home.  Noted that patient has a left inferior pubic rami fracture.   I have meet with patient, her daughter [name] and her husband at bedside on yesterday.  I have discussed that PT has recommended SNF placement.  I have explained the placement process.  [name], patient's daughter reports that prior to admission patient lived at home with her husband.  [name] reports that she has two preferences for SNF placement.  [name] reports that her SNF preferences are [facility], and [facility].    I have completed SNF work up and sent SNF referral to [facility] and [facility].  Patient and family where also agreeable to sending SNF referral to [facility] area facilities.    TOC will continue to follow for discharge planning.            Expected Discharge Plan and Services                                               Social Determinants of Health (SDOH) Interventions SDOH Screenings   Food Insecurity: No Food Insecurity (01/27/2023)  Housing: Low Risk  (01/27/2023)  Transportation Needs: No Transportation Needs (01/27/2023)  Utilities: Not At Risk (01/27/2023)  Tobacco Use: Low Risk  (01/27/2023)    Readmission Risk Interventions     No data to display

## 2023-01-30 NOTE — Plan of Care (Signed)

## 2023-01-30 NOTE — Progress Notes (Signed)
PROGRESS NOTE    Caitlyn Burns   ZOX:[id number] DOB: [birth year]  DOA: 01/27/2023 Date of Service: 01/30/23 PCP: Lynnea Ferrier, MD     Brief Narrative / Hospital Course:  Caitlyn Burns is a 89 y.o. female with medical history significant for hypertension, dementia, arthritis, history of stroke presenting from home with falls.  Patient has been falling over the past 2 days no reports of striking her head she has a history of dementia  08/19 to ED from home. Imaging showed left inferior pubic rami fracture. Admitted to hospitalist service. Hgb 11.6 08/20: per ortho, no surgery, WBAT, PT/OT 08/21: PT/OT recs for SNF rehab, TOC working on placement 08/22: Hgb down to 9.3 --> recheck at 10.3 no s/s bleeding   Consultants:  Orthopedics  Procedures: None       ASSESSMENT & PLAN:   Principal Problem:   Frequent falls Active Problems:   Fall at home, initial encounter   Inferior pubic ramus fracture, left, closed, initial encounter (HCC)   Benign essential hypertension   CAD (coronary artery disease), native coronary artery   Dementia arising in the senium and presenium (HCC)   Closed fracture of left superior rim of pubis (HCC)   Sacral fracture, closed (HCC)  Inferior pubic ramus fracture, left, closed, initial encounter (HCC) Orthopedics - Dr. Audelia Acton - no surgical intervention is planned Orthopedic surgery recommends weightbearing as tolerated with assistive devices and therapy Outpatient follow-up with orthopedics in 2 weeks for follow-up serial imaging Pain control SNF rehab  Anemia Hgb to 9.3 today from 11.7  No s/s bleeding now  Suspect some hemodilutional  IV fluids have been d/c Monitor H/H Iron levels pending   Frequent falls Fall at home, initial encounter PT OT  SNF rehab  Fall precaution.   Dementia  Will monitor . No reports of agitation or behavioral disturbance in PMH.    CAD (coronary artery disease), native coronary artery Denies any acute  chest pains Continue current statin therapy   Benign essential hypertension Continue lopressor.  Prn hydralazine.  Monitor blood pressure closely      DVT prophylaxis: lovenox Pertinent IV fluids/nutrition: no continuous IV fluids  Central lines / invasive devices: none  Code Status: FULL CODE ACP documentation reviewed: none on file   Current Admission Status: inpatient   TOC needs / Dispo plan: SNF/STR placement Barriers to discharge / significant pending items: placement              Subjective / Brief ROS:  Patient reports tired but better than yesterday Feels like she has to pee but can't (PurWick in place)  Denies CP/SOB.  Pain is only complaint.  Denies new weakness.  Tolerating diet.    Family Communication: husband is at bedside on rounds     Objective Findings:  Vitals:   01/29/23 0900 01/29/23 1741 01/29/23 2021 01/30/23 1009  BP: 111/75 121/62 113/63 (!) 111/59  Pulse:  90 84 85  Resp: 20 18 20 16   Temp: 97.9 F (36.6 C)  98.2 F (36.8 C) 98 F (36.7 C)  TempSrc: Oral  Oral   SpO2: 98% 98% 99% 100%  Weight:      Height:        Intake/Output Summary (Last 24 hours) at 01/30/2023 1445 Last data filed at 01/30/2023 1441 Gross per 24 hour  Intake 2334.52 ml  Output 1 ml  Net 2333.52 ml   Filed Weights   01/27/23 1626  Weight: 49.4 kg    Examination:  Physical Exam Constitutional:      General: She is not in acute distress. Cardiovascular:     Rate and Rhythm: Normal rate and regular rhythm.     Heart sounds: Murmur heard.  Pulmonary:     Effort: Pulmonary effort is normal.     Breath sounds: Normal breath sounds.  Musculoskeletal:     Right lower leg: No edema.     Left lower leg: No edema.  Neurological:     General: No focal deficit present.     Mental Status: She is alert. Mental status is at baseline.  Psychiatric:        Mood and Affect: Mood normal.          Scheduled Medications:   clopidogrel  75 mg  Oral Daily   docusate sodium  100 mg Oral BID   donepezil  5 mg Oral QHS   enoxaparin (LOVENOX) injection  40 mg Subcutaneous Q24H   pravastatin  20 mg Oral QHS    Continuous Infusions:  methocarbamol (ROBAXIN) IV      PRN Medications:  bisacodyl, HYDROcodone-acetaminophen, methocarbamol **OR** methocarbamol (ROBAXIN) IV, polyethylene glycol  Antimicrobials from admission:  Anti-infectives (From admission, onward)    None           Data Reviewed:  I have personally reviewed the following...  CBC: Recent Labs  Lab 01/25/23 1159 01/27/23 1822 01/28/23 0421 01/29/23 0326 01/30/23 0216 01/30/23 1323  WBC 6.6 11.6* 9.8 8.3  --   --   NEUTROABS 4.8  --   --  5.8  --   --   HGB 12.2 11.7* 10.4* 9.7* 9.3* 10.3*  HCT 37.1 34.4* 30.3* 27.9* 27.1* 30.8*  MCV 96.9 94.2 92.7 92.4  --   --   PLT 172 152 140* 143*  --   --    Basic Metabolic Panel: Recent Labs  Lab 01/25/23 1159 01/27/23 1822 01/28/23 0421 01/29/23 0326  NA 138 139 140 136  K 4.2 3.9 3.9 3.9  CL 109 107 111 111  CO2 24 24 22  21*  GLUCOSE 93 168* 122* 111*  BUN 14 24* 23 19  CREATININE 0.85 0.90 0.76 0.73  CALCIUM 8.5* 9.7 8.7* 8.1*   GFR: Estimated Creatinine Clearance: 36.4 mL/min (by C-G formula based on SCr of 0.73 mg/dL). Liver Function Tests: No results for input(s): "AST", "ALT", "ALKPHOS", "BILITOT", "PROT", "ALBUMIN" in the last 168 hours. No results for input(s): "LIPASE", "AMYLASE" in the last 168 hours. No results for input(s): "AMMONIA" in the last 168 hours. Coagulation Profile: Recent Labs  Lab 01/27/23 1822  INR 1.2   Cardiac Enzymes: No results for input(s): "CKTOTAL", "CKMB", "CKMBINDEX", "TROPONINI" in the last 168 hours. BNP (last 3 results) No results for input(s): "PROBNP" in the last 8760 hours. HbA1C: No results for input(s): "HGBA1C" in the last 72 hours. CBG: No results for input(s): "GLUCAP" in the last 168 hours. Lipid Profile: No results for input(s):  "CHOL", "HDL", "LDLCALC", "TRIG", "CHOLHDL", "LDLDIRECT" in the last 72 hours. Thyroid Function Tests: No results for input(s): "TSH", "T4TOTAL", "FREET4", "T3FREE", "THYROIDAB" in the last 72 hours. Anemia Panel: Recent Labs    01/30/23 0216  FERRITIN 372*  TIBC 203*  IRON 56   Most Recent Urinalysis On File:     Component Value Date/Time   COLORURINE YELLOW (A) 01/25/2023 1301   APPEARANCEUR HAZY (A) 01/25/2023 1301   LABSPEC 1.026 01/25/2023 1301   PHURINE 5.0 01/25/2023 1301   GLUCOSEU NEGATIVE 01/25/2023 1301  HGBUR NEGATIVE 01/25/2023 1301   BILIRUBINUR NEGATIVE 01/25/2023 1301   KETONESUR 5 (A) 01/25/2023 1301   PROTEINUR NEGATIVE 01/25/2023 1301   NITRITE NEGATIVE 01/25/2023 1301   LEUKOCYTESUR NEGATIVE 01/25/2023 1301   Sepsis Labs: @LABRCNTIP (procalcitonin:4,lacticidven:4) Microbiology: No results found for this or any previous visit (from the past 240 hour(s)).    Radiology Studies last 3 days: CT PELVIS WO CONTRAST  Result Date: 01/27/2023 CLINICAL DATA:  89 year old female with dementia who presents after fall. Patient has fallen multiple times over the last few days. Evaluate fracture seen on radiograph 01/27/2023 EXAM: CT PELVIS WITHOUT CONTRAST TECHNIQUE: Multidetector CT imaging of the pelvis was performed following the standard protocol without intravenous contrast. RADIATION DOSE REDUCTION: This exam was performed according to the departmental dose-optimization program which includes automated exposure control, adjustment of the mA and/or kV according to patient size and/or use of iterative reconstruction technique. COMPARISON:  Radiograph 01/27/2023 FINDINGS: Urinary Tract:  No abnormality visualized. Bowel:  Unremarkable visualized pelvic bowel loops. Vascular/Lymphatic: Arterial atherosclerotic calcification. No lymphadenopathy. Reproductive:  No acute abnormality. Other:  Small amount of blood in the pelvis. Musculoskeletal: Acute comminuted fracture of the  left superior pubic ramus extending into the pubic body. Additional displaced fracture of the left inferior pubic ramus. Mildly displaced right sacral ala fractures. Left hip hemiarthroplasty. IMPRESSION: 1. Acute comminuted fracture of the left superior pubic ramus extending into the pubic body. 2. Mildly displaced fracture of the left inferior pubic ramus. 3. Mildly displaced right sacral ala fractures. 4. Small volume extraperitoneal blood in the pelvis. Electronically Signed   By: Minerva Fester M.D.   On: 01/27/2023 20:31   CT Head Wo Contrast  Result Date: 01/27/2023 CLINICAL DATA:  Trauma. EXAM: CT HEAD WITHOUT CONTRAST CT CERVICAL SPINE WITHOUT CONTRAST TECHNIQUE: Multidetector CT imaging of the head and cervical spine was performed following the standard protocol without intravenous contrast. Multiplanar CT image reconstructions of the cervical spine were also generated. RADIATION DOSE REDUCTION: This exam was performed according to the departmental dose-optimization program which includes automated exposure control, adjustment of the mA and/or kV according to patient size and/or use of iterative reconstruction technique. COMPARISON:  Head CT 01/25/2023. FINDINGS: CT HEAD FINDINGS Brain: There is periventricular white matter decreased attenuation consistent with small vessel ischemic changes. Ventricles, sulci and cisterns are prominent consistent with age related involutional changes. No acute intracranial hemorrhage, mass effect or shift. No hydrocephalus. Vascular: No hyperdense vessel or unexpected calcification. Skull: Normal. Negative for fracture or focal lesion. Sinuses/Orbits: No acute finding. CT CERVICAL SPINE FINDINGS Alignment: Normal. Skull base and vertebrae: No acute fracture. No primary bone lesion or focal pathologic process. Soft tissues and spinal canal: No prevertebral fluid or swelling. No visible canal hematoma. Disc levels: Disc space narrowing with sclerosis and marginal  osteophytes consistent with degenerative disc disease C4-C7. Facet joint osteoarthritic changes C3-4 through C7-T1. Upper chest: Biapical pleural/parenchymal changes likely representing scarring. IMPRESSION: 1. Atrophy and chronic small vessel ischemic changes. 2. No acute intracranial process identified. 3. Cervical degenerative changes. 4. No acute traumatic abnormalities of the cervical spine. Electronically Signed   By: Layla Maw M.D.   On: 01/27/2023 18:22   CT Cervical Spine Wo Contrast  Result Date: 01/27/2023 CLINICAL DATA:  Trauma. EXAM: CT HEAD WITHOUT CONTRAST CT CERVICAL SPINE WITHOUT CONTRAST TECHNIQUE: Multidetector CT imaging of the head and cervical spine was performed following the standard protocol without intravenous contrast. Multiplanar CT image reconstructions of the cervical spine were also generated. RADIATION DOSE REDUCTION: This  exam was performed according to the departmental dose-optimization program which includes automated exposure control, adjustment of the mA and/or kV according to patient size and/or use of iterative reconstruction technique. COMPARISON:  Head CT 01/25/2023. FINDINGS: CT HEAD FINDINGS Brain: There is periventricular white matter decreased attenuation consistent with small vessel ischemic changes. Ventricles, sulci and cisterns are prominent consistent with age related involutional changes. No acute intracranial hemorrhage, mass effect or shift. No hydrocephalus. Vascular: No hyperdense vessel or unexpected calcification. Skull: Normal. Negative for fracture or focal lesion. Sinuses/Orbits: No acute finding. CT CERVICAL SPINE FINDINGS Alignment: Normal. Skull base and vertebrae: No acute fracture. No primary bone lesion or focal pathologic process. Soft tissues and spinal canal: No prevertebral fluid or swelling. No visible canal hematoma. Disc levels: Disc space narrowing with sclerosis and marginal osteophytes consistent with degenerative disc disease  C4-C7. Facet joint osteoarthritic changes C3-4 through C7-T1. Upper chest: Biapical pleural/parenchymal changes likely representing scarring. IMPRESSION: 1. Atrophy and chronic small vessel ischemic changes. 2. No acute intracranial process identified. 3. Cervical degenerative changes. 4. No acute traumatic abnormalities of the cervical spine. Electronically Signed   By: Layla Maw M.D.   On: 01/27/2023 18:22   DG Femur Min 2 Views Left  Result Date: 01/27/2023 CLINICAL DATA:  Fall, initial encounter.  Left hip and leg pain. EXAM: PELVIS - 1-2 VIEW; LEFT FEMUR 2 VIEWS COMPARISON:  None Available. FINDINGS: Pelvis: Displaced left inferior pubic ramus fracture. There is a mildly comminuted and displaced fracture the left superior ramus abutting the pubic body. Fracture may extend to the pubic symphysis. No definite disruption of sacral ala. Moderate right hip osteoarthritis. Femur: Left hip arthroplasty in expected alignment. There is no periprosthetic lucency or fracture. The distal femur is intact. Knee alignment is maintained. No focal soft tissue abnormalities. IMPRESSION: 1. Displaced left inferior pubic ramus fracture. 2. Mildly comminuted and displaced fracture of the left superior pubic ramus abutting the pubic body. Fracture may extend to the pubic symphysis. 3. No fracture of the left hip or femur. Left hip arthroplasty is intact. Electronically Signed   By: Narda Rutherford M.D.   On: 01/27/2023 17:15   DG Pelvis 1-2 Views  Result Date: 01/27/2023 CLINICAL DATA:  Fall, initial encounter.  Left hip and leg pain. EXAM: PELVIS - 1-2 VIEW; LEFT FEMUR 2 VIEWS COMPARISON:  None Available. FINDINGS: Pelvis: Displaced left inferior pubic ramus fracture. There is a mildly comminuted and displaced fracture the left superior ramus abutting the pubic body. Fracture may extend to the pubic symphysis. No definite disruption of sacral ala. Moderate right hip osteoarthritis. Femur: Left hip arthroplasty in  expected alignment. There is no periprosthetic lucency or fracture. The distal femur is intact. Knee alignment is maintained. No focal soft tissue abnormalities. IMPRESSION: 1. Displaced left inferior pubic ramus fracture. 2. Mildly comminuted and displaced fracture of the left superior pubic ramus abutting the pubic body. Fracture may extend to the pubic symphysis. 3. No fracture of the left hip or femur. Left hip arthroplasty is intact. Electronically Signed   By: Narda Rutherford M.D.   On: 01/27/2023 17:15             LOS: 3 days      Sunnie Nielsen, DO [facility] 01/30/2023, 2:45 PM    Dictation software may have been used to generate the above note. Typos may occur and escape review in typed/dictated notes. Please contact Dr Lyn Hollingshead directly for clarity if needed.  Staff may message me via secure chat  in Epic  but this may not receive an immediate response,  please page me for urgent matters!  If 7PM-7AM, please contact night coverage [email or web address]

## 2023-01-30 NOTE — NC FL2 (Signed)
[place] MEDICAID FL2 LEVEL OF CARE FORM     IDENTIFICATION  Patient Name: Caitlyn Burns Birthdate: April 03, 1931 Sex: female Admission Date (Current Location): 01/27/2023  [facility] and [place] Number:  Chiropodist and Address:  [facility], [address] [place], [place] [address]      Provider Number: [id number]  Attending Physician Name and Address:  Sunnie Nielsen, DO  Relative Name and Phone Number:  Lonia Blood [phone]    Current Level of Care: Hospital Recommended Level of Care: Skilled Nursing Facility Prior Approval Number:    Date Approved/Denied:   PASRR Number: [id number]  Discharge Plan: SNF    Current Diagnoses: Patient Active Problem List   Diagnosis Date Noted   Closed fracture of left superior rim of pubis (HCC) 01/28/2023   Sacral fracture, closed (HCC) 01/28/2023   Inferior pubic ramus fracture, left, closed, initial encounter (HCC) 01/27/2023   Frequent falls 01/27/2023   Fall at home, initial encounter 01/27/2023   Dementia arising in the senium and presenium (HCC) 06/21/2021   Benign essential hypertension 02/21/2014   CAD (coronary artery disease), native coronary artery 02/21/2014    Orientation RESPIRATION BLADDER Height & Weight     Self, Time, Situation  Normal Incontinent Weight: 49.4 kg Height:  5\' 3"  (160 cm)  BEHAVIORAL SYMPTOMS/MOOD NEUROLOGICAL BOWEL NUTRITION STATUS      Incontinent  (See discharge summary)  AMBULATORY STATUS COMMUNICATION OF NEEDS Skin   Extensive Assist Verbally Normal                       Personal Care Assistance Level of Assistance  Bathing, Feeding, Dressing Bathing Assistance: Maximum assistance Feeding assistance: Limited assistance Dressing Assistance: Maximum assistance     Functional Limitations Info  Sight, Hearing, Speech Sight Info: Impaired (Wears glasses) Hearing Info: Adequate Speech Info: Adequate    SPECIAL CARE FACTORS FREQUENCY  PT  (By licensed PT), OT (By licensed OT)     PT Frequency: 5x weekly OT Frequency: 5x weekly            Contractures Contractures Info: Not present    Additional Factors Info  Code Status, Allergies Code Status Info: Full Allergies Info: Doxycycline, Tramadol           Current Medications (01/30/2023):  This is the current hospital active medication list Current Facility-Administered Medications  Medication Dose Route Frequency Provider Last Rate Last Admin   bisacodyl (DULCOLAX) EC tablet 5 mg  5 mg Oral Daily PRN Gertha Calkin, MD       docusate sodium (COLACE) capsule 100 mg  100 mg Oral BID Irena Cords V, MD   100 mg at 01/29/23 1015   enoxaparin (LOVENOX) injection 40 mg  40 mg Subcutaneous Q24H Rosezetta Schlatter T, MD   40 mg at 01/29/23 1552   HYDROcodone-acetaminophen (NORCO/VICODIN) 5-325 MG per tablet 1-2 tablet  1-2 tablet Oral Q4H PRN Sunnie Nielsen, DO       methocarbamol (ROBAXIN) tablet 500 mg  500 mg Oral Q6H PRN Gertha Calkin, MD   500 mg at 01/29/23 1016   Or   methocarbamol (ROBAXIN) 500 mg in dextrose 5 % 50 mL IVPB  500 mg Intravenous Q6H PRN Gertha Calkin, MD       polyethylene glycol (MIRALAX / GLYCOLAX) packet 17 g  17 g Oral Daily PRN Gertha Calkin, MD       pravastatin (PRAVACHOL) tablet 20 mg  20 mg Oral QHS  Loyce Dys, MD   20 mg at 01/29/23 2159     Discharge Medications: Please see discharge summary for a list of discharge medications.  Relevant Imaging Results:  Relevant Lab Results:   Additional Information [id number]  Garret Reddish, RN

## 2023-01-30 NOTE — Care Management Important Message (Signed)
Important Message  Patient Details  Name: Caitlyn Burns MRN: [id number] Date of Birth: Aug 06, 1930   Medicare Important Message Given:  N/A - LOS <3 / Initial given by admissions     Olegario Messier A Esias Mory 01/30/2023, 9:38 AM

## 2023-01-30 NOTE — Progress Notes (Signed)
Occupational Therapy Treatment Patient Details Name: Caitlyn Burns MRN: [id number] DOB: 10-Sep-1930 Today's Date: 01/30/2023   History of present illness Pt is a 89 y.o. female with medical history significant for hypertension, dementia, arthritis, history of stroke presenting from home with falls. Imaging revealing of a left superior and inferior pubic ramus fracture as well as a right sacral insufficiency fracture consistent with a posterior and anterior pelvic ring injury per ortho plan for WBAT with AD use.   OT comments  Pt seen for OT tx and co-tx with PT to optimize functional mobility training. Spouse present and supportive throughout. Pt required MOD A +2 for bed mobility. Once EOB required initial MIN A for static sitting balance with UE support on EOB, improving to SBA with no UE support on EOB for seated grooming tasks. VC required for sequencing of teeth brushing for applying toothpaste. Pt required MOD A +2 for STS and step pivot to recliner with VC for sequencing and hand/foot placement. Pt endorsing some pain with mobility but endorsed that it was improving slowly once in recliner. Pt progressing towards goals. Continues to benefit from skilled OT Services.       If plan is discharge home, recommend the following:  Two people to help with walking and/or transfers;Two people to help with bathing/dressing/bathroom;Supervision due to cognitive status;Help with stairs or ramp for entrance   Equipment Recommendations  BSC/3in1;Hospital bed    Recommendations for Other Services      Precautions / Restrictions Precautions Precautions: Fall Restrictions Weight Bearing Restrictions: Yes LLE Weight Bearing: Weight bearing as tolerated       Mobility Bed Mobility Overal bed mobility: Needs Assistance Bed Mobility: Supine to Sit     Supine to sit: Mod assist, +2 for physical assistance          Transfers Overall transfer level: Needs assistance Equipment used: Rolling  walker (2 wheels) Transfers: Sit to/from Stand, Bed to chair/wheelchair/BSC Sit to Stand: Mod assist, Max assist, +2 safety/equipment     Step pivot transfers: Mod assist, +2 physical assistance, +2 safety/equipment           Balance Overall balance assessment: Needs assistance Sitting-balance support: Feet supported, No upper extremity supported Sitting balance-Leahy Scale: Fair Sitting balance - Comments: SBA for static sitting balance without UE support Postural control: Posterior lean Standing balance support: Bilateral upper extremity supported, Reliant on assistive device for balance, During functional activity Standing balance-Leahy Scale: Poor                             ADL either performed or assessed with clinical judgement   ADL                                         General ADL Comments: Once EOB, pt set up for grooming tasks requiring SBA for sitting balance and PRN VC for sequencing for brushing teeth.    Extremity/Trunk Assessment              Vision       Perception     Praxis      Cognition Arousal: Alert Behavior During Therapy: WFL for tasks assessed/performed Overall Cognitive Status: History of cognitive impairments - at baseline  Exercises      Shoulder Instructions       General Comments      Pertinent Vitals/ Pain       Pain Assessment Pain Assessment: Faces Faces Pain Scale: Hurts little more Pain Location: L hip Pain Descriptors / Indicators: Discomfort, Grimacing, Guarding Pain Intervention(s): Monitored during session, Repositioned  Home Living                                          Prior Functioning/Environment              Frequency  Min 1X/week        Progress Toward Goals  OT Goals(current goals can now be found in the care plan section)  Progress towards OT goals: Progressing toward  goals  Acute Rehab OT Goals Patient Stated Goal: to return home when safe to do so OT Goal Formulation: With family Time For Goal Achievement: 02/12/23 Potential to Achieve Goals: Good  Plan      Co-evaluation    PT/OT/SLP Co-Evaluation/Treatment: Yes Reason for Co-Treatment: For patient/therapist safety;To address functional/ADL transfers PT goals addressed during session: Mobility/safety with mobility;Balance;Proper use of DME OT goals addressed during session: ADL's and self-care;Proper use of Adaptive equipment and DME      AM-PAC OT "6 Clicks" Daily Activity     Outcome Measure   Help from another person eating meals?: A Little Help from another person taking care of personal grooming?: A Little Help from another person toileting, which includes using toliet, bedpan, or urinal?: A Lot Help from another person bathing (including washing, rinsing, drying)?: A Lot Help from another person to put on and taking off regular upper body clothing?: A Little Help from another person to put on and taking off regular lower body clothing?: A Lot 6 Click Score: 15    End of Session Equipment Utilized During Treatment: Rolling walker (2 wheels)  OT Visit Diagnosis: Other abnormalities of gait and mobility (R26.89);Muscle weakness (generalized) (M62.81);Pain Pain - Right/Left: Left Pain - part of body: Hip   Activity Tolerance Patient tolerated treatment well   Patient Left in chair;with call bell/phone within reach;with chair alarm set;with family/visitor present   Nurse Communication          Time: 0630-1601 OT Time Calculation (min): 25 min  Charges: OT General Charges $OT Visit: 1 Visit OT Treatments $Self Care/Home Management : 8-22 mins  Arman Filter., MPH, MS, OTR/L ascom [phone] 01/30/23, 1:27 PM

## 2023-01-31 ENCOUNTER — Other Ambulatory Visit: Payer: Self-pay | Admitting: Student

## 2023-01-31 DIAGNOSIS — R296 Repeated falls: Secondary | ICD-10-CM | POA: Diagnosis not present

## 2023-01-31 DIAGNOSIS — S32512K Fracture of superior rim of left pubis, subsequent encounter for fracture with nonunion: Secondary | ICD-10-CM

## 2023-01-31 LAB — HEMOGLOBIN AND HEMATOCRIT, BLOOD
HCT: 31.1 % — ABNORMAL LOW (ref 36.0–46.0)
Hemoglobin: 10.5 g/dL — ABNORMAL LOW (ref 12.0–15.0)

## 2023-01-31 MED ORDER — DOCUSATE SODIUM 100 MG PO CAPS: 100.0000 mg | ORAL_CAPSULE | Freq: Two times a day (BID) | ORAL | Status: DC

## 2023-01-31 MED ORDER — METHOCARBAMOL 500 MG PO TABS: 500.0000 mg | ORAL_TABLET | Freq: Four times a day (QID) | ORAL | 0 refills | Status: DC | PRN

## 2023-01-31 MED ORDER — POLYETHYLENE GLYCOL 3350 17 G PO PACK: 17.0000 g | PACK | Freq: Every day | ORAL | Status: DC | PRN

## 2023-01-31 MED ORDER — HYDROCODONE-ACETAMINOPHEN 5-325 MG PO TABS: 1.0000 | ORAL_TABLET | Freq: Four times a day (QID) | ORAL | 0 refills | Status: DC | PRN

## 2023-01-31 MED ORDER — BISACODYL 5 MG PO TBEC: 5.0000 mg | DELAYED_RELEASE_TABLET | Freq: Every day | ORAL | Status: DC | PRN

## 2023-01-31 NOTE — Plan of Care (Signed)
  Problem: Activity: Goal: Risk for activity intolerance will decrease Outcome: Progressing   Problem: Nutrition: Goal: Adequate nutrition will be maintained Outcome: Progressing   Problem: Pain Managment: Goal: General experience of comfort will improve Outcome: Progressing   Problem: Safety: Goal: Ability to remain free from injury will improve Outcome: Progressing   

## 2023-01-31 NOTE — Discharge Summary (Addendum)
Physician Discharge Summary   Patient: Caitlyn Burns MRN: [id number]  DOB: 1931-04-24   Admit:     Date of Admission: 01/27/2023 Admitted from: home   Discharge: Date of discharge: 01/31/23 Disposition: Skilled nursing facility Condition at discharge: good  CODE STATUS: FULL CODE     Discharge Physician: Sunnie Nielsen, DO [facility]     PCP: Lynnea Ferrier, MD  Recommendations for Outpatient Follow-up:  Follow up with PCP Curtis Sites III, MD in 2-4 weeks Outpatient follow-up with orthopedics in 2 weeks for follow-up imaging Please obtain labs/tests: CBC in 2-3 days Please follow up on the following pending results: none    Discharge Instructions     Diet - low sodium heart healthy   Complete by: As directed    Increase activity slowly   Complete by: As directed          Discharge Diagnoses: Principal Problem:   Frequent falls Active Problems:   Fall at home, initial encounter   Inferior pubic ramus fracture, left, closed, initial encounter (HCC)   Benign essential hypertension   CAD (coronary artery disease), native coronary artery   Dementia arising in the senium and presenium (HCC)   Closed fracture of left superior rim of pubis (HCC)   Sacral fracture, closed [facility])       Hospital Course: Caitlyn Burns is a 89 y.o. female with medical history significant for hypertension, dementia, arthritis, history of stroke presenting from home with falls.  Patient has been falling over the past 2 days no reports of striking her head she has a history of dementia  08/19 to ED from home. Imaging showed left inferior pubic rami fracture. Admitted to hospitalist service. Hgb 11.6 08/20: per ortho, no surgery, WBAT, PT/OT 08/21: PT/OT recs for SNF rehab, TOC working on placement 08/22: Hgb down to 9.3 --> recheck at 10.3 no s/s bleeding.  08/23: Hgb 10.5. stable for discharge and placement confirmed   Consultants:   Orthopedics  Procedures: None       ASSESSMENT & PLAN:   Inferior pubic ramus fracture, left, closed, initial encounter (HCC) Orthopedics - Dr. Audelia Acton - no surgical intervention is planned Orthopedic surgery recs weightbearing as tolerated with assistive devices and therapy Outpatient follow-up with orthopedics in 2 weeks for follow-up imaging Pain control SNF rehab  Anemia - improved/stable No s/s bleeding now  Suspect some hemodilutional  IV fluids have been d/c Monitor H/H   Frequent falls Fall at home, initial encounter PT OT  SNF rehab  Fall precaution.   Dementia  Will monitor . No reports of agitation or behavioral disturbance in PMH.  Continue home donepezil    CAD (coronary artery disease), native coronary artery Denies any acute chest pains Continue current statin therapy   Benign essential hypertension Continue lopressor.  Prn hydralazine.  Monitor blood pressure closely      DVT prophylaxis: lovenox Pertinent IV fluids/nutrition: no continuous IV fluids  Central lines / invasive devices: none  Code Status: FULL CODE ACP documentation reviewed: none on file   Current Admission Status: inpatient   TOC needs / Dispo plan: SNF/STR placement Barriers to discharge / significant pending items: placement             Discharge Instructions  Allergies as of 01/31/2023       Reactions   Doxycycline Nausea And Vomiting   Tramadol         Medication List     STOP taking these  medications    metoprolol succinate 25 MG 24 hr tablet Commonly known as: TOPROL-XL   metoprolol tartrate 25 MG tablet Commonly known as: LOPRESSOR   triamterene-hydrochlorothiazide 37.5-25 MG tablet Commonly known as: MAXZIDE-25       TAKE these medications    acetaminophen 500 MG tablet Commonly known as: TYLENOL Take 500 mg by mouth daily as needed for moderate pain or headache.   ALAWAY OP Apply 1 drop to eye daily as needed (dry eyes).    B-12 PO Take 1 tablet by mouth daily at 12 noon.   bisacodyl 5 MG EC tablet Commonly known as: DULCOLAX Take 1 tablet (5 mg total) by mouth daily as needed for moderate constipation.   Calcium 600+D 600-800 MG-UNIT Tabs Generic drug: Calcium Carb-Cholecalciferol Take 1 tablet by mouth daily.   CENTRUM SILVER PO Take 1 tablet by mouth daily. What changed: Another medication with the same name was removed. Continue taking this medication, and follow the directions you see here.   clopidogrel 75 MG tablet Commonly known as: PLAVIX Take 75 mg by mouth daily.   diclofenac Sodium 1 % Gel Commonly known as: VOLTAREN Apply 2 g topically 4 (four) times daily.   docusate sodium 100 MG capsule Commonly known as: COLACE Take 1 capsule (100 mg total) by mouth 2 (two) times daily.   donepezil 5 MG tablet Commonly known as: ARICEPT Take 5 mg by mouth daily.   HYDROcodone-acetaminophen 5-325 MG tablet Commonly known as: NORCO/VICODIN Take 1-2 tablets by mouth every 6 (six) hours as needed for severe pain or moderate pain.   Krill Oil 350 MG Caps Take 1 capsule by mouth daily.   methocarbamol 500 MG tablet Commonly known as: ROBAXIN Take 1 tablet (500 mg total) by mouth every 6 (six) hours as needed for muscle spasms.   polyethylene glycol 17 g packet Commonly known as: MIRALAX / GLYCOLAX Take 17 g by mouth daily as needed for mild constipation.   pravastatin 20 MG tablet Commonly known as: PRAVACHOL Take 20 mg by mouth at bedtime.          Allergies  Allergen Reactions   Doxycycline Nausea And Vomiting   Tramadol      Subjective: pt reports pain is controlled, no other problems at this time, no concerns for discharge. Husband is at bedside, also no concerns    Discharge Exam: BP (!) 112/50 (BP Location: Left Arm)   Pulse 99   Temp 98.2 F (36.8 C) (Oral)   Resp 16   Ht 5\' 3"  (1.6 m)   Wt 49.4 kg   SpO2 98%   BMI 19.31 kg/m  General: Pt is alert, awake,  not in acute distress Cardiovascular: RRR, S1/S2, no rubs, no gallops Respiratory: CTA bilaterally, no wheezing, no rhonchi Abdominal: Soft, NT, ND, bowel sounds +WNL Extremities: no edema, no cyanosis, moving feet     The results of significant diagnostics from this hospitalization (including imaging, microbiology, ancillary and laboratory) are listed below for reference.     Microbiology: No results found for this or any previous visit (from the past 240 hour(s)).   Labs: BNP (last 3 results) No results for input(s): "BNP" in the last 8760 hours. Basic Metabolic Panel: Recent Labs  Lab 01/25/23 1159 01/27/23 1822 01/28/23 0421 01/29/23 0326  NA 138 139 140 136  K 4.2 3.9 3.9 3.9  CL 109 107 111 111  CO2 24 24 22  21*  GLUCOSE 93 168* 122* 111*  BUN 14 24* 23  19  CREATININE 0.85 0.90 0.76 0.73  CALCIUM 8.5* 9.7 8.7* 8.1*   Liver Function Tests: No results for input(s): "AST", "ALT", "ALKPHOS", "BILITOT", "PROT", "ALBUMIN" in the last 168 hours. No results for input(s): "LIPASE", "AMYLASE" in the last 168 hours. No results for input(s): "AMMONIA" in the last 168 hours. CBC: Recent Labs  Lab 01/25/23 1159 01/27/23 1822 01/28/23 0421 01/29/23 0326 01/30/23 0216 01/30/23 1323 01/31/23 0424  WBC 6.6 11.6* 9.8 8.3  --   --   --   NEUTROABS 4.8  --   --  5.8  --   --   --   HGB 12.2 11.7* 10.4* 9.7* 9.3* 10.3* 10.5*  HCT 37.1 34.4* 30.3* 27.9* 27.1* 30.8* 31.1*  MCV 96.9 94.2 92.7 92.4  --   --   --   PLT 172 152 140* 143*  --   --   --    Cardiac Enzymes: No results for input(s): "CKTOTAL", "CKMB", "CKMBINDEX", "TROPONINI" in the last 168 hours. BNP: Invalid input(s): "POCBNP" CBG: No results for input(s): "GLUCAP" in the last 168 hours. D-Dimer No results for input(s): "DDIMER" in the last 72 hours. Hgb A1c No results for input(s): "HGBA1C" in the last 72 hours. Lipid Profile No results for input(s): "CHOL", "HDL", "LDLCALC", "TRIG", "CHOLHDL", "LDLDIRECT"  in the last 72 hours. Thyroid function studies No results for input(s): "TSH", "T4TOTAL", "T3FREE", "THYROIDAB" in the last 72 hours.  Invalid input(s): "FREET3" Anemia work up Recent Labs    01/30/23 0216  FERRITIN 372*  TIBC 203*  IRON 56   Urinalysis    Component Value Date/Time   COLORURINE YELLOW (A) 01/25/2023 1301   APPEARANCEUR HAZY (A) 01/25/2023 1301   LABSPEC 1.026 01/25/2023 1301   PHURINE 5.0 01/25/2023 1301   GLUCOSEU NEGATIVE 01/25/2023 1301   HGBUR NEGATIVE 01/25/2023 1301   BILIRUBINUR NEGATIVE 01/25/2023 1301   KETONESUR 5 (A) 01/25/2023 1301   PROTEINUR NEGATIVE 01/25/2023 1301   NITRITE NEGATIVE 01/25/2023 1301   LEUKOCYTESUR NEGATIVE 01/25/2023 1301   Sepsis Labs Recent Labs  Lab 01/25/23 1159 01/27/23 1822 01/28/23 0421 01/29/23 0326  WBC 6.6 11.6* 9.8 8.3   Microbiology No results found for this or any previous visit (from the past 240 hour(s)). Imaging CT PELVIS WO CONTRAST  Result Date: 01/27/2023 CLINICAL DATA:  89 year old female with dementia who presents after fall. Patient has fallen multiple times over the last few days. Evaluate fracture seen on radiograph 01/27/2023 EXAM: CT PELVIS WITHOUT CONTRAST TECHNIQUE: Multidetector CT imaging of the pelvis was performed following the standard protocol without intravenous contrast. RADIATION DOSE REDUCTION: This exam was performed according to the departmental dose-optimization program which includes automated exposure control, adjustment of the mA and/or kV according to patient size and/or use of iterative reconstruction technique. COMPARISON:  Radiograph 01/27/2023 FINDINGS: Urinary Tract:  No abnormality visualized. Bowel:  Unremarkable visualized pelvic bowel loops. Vascular/Lymphatic: Arterial atherosclerotic calcification. No lymphadenopathy. Reproductive:  No acute abnormality. Other:  Small amount of blood in the pelvis. Musculoskeletal: Acute comminuted fracture of the left superior pubic  ramus extending into the pubic body. Additional displaced fracture of the left inferior pubic ramus. Mildly displaced right sacral ala fractures. Left hip hemiarthroplasty. IMPRESSION: 1. Acute comminuted fracture of the left superior pubic ramus extending into the pubic body. 2. Mildly displaced fracture of the left inferior pubic ramus. 3. Mildly displaced right sacral ala fractures. 4. Small volume extraperitoneal blood in the pelvis. Electronically Signed   By: Minerva Fester M.D.   On:  01/27/2023 20:31   CT Head Wo Contrast  Result Date: 01/27/2023 CLINICAL DATA:  Trauma. EXAM: CT HEAD WITHOUT CONTRAST CT CERVICAL SPINE WITHOUT CONTRAST TECHNIQUE: Multidetector CT imaging of the head and cervical spine was performed following the standard protocol without intravenous contrast. Multiplanar CT image reconstructions of the cervical spine were also generated. RADIATION DOSE REDUCTION: This exam was performed according to the departmental dose-optimization program which includes automated exposure control, adjustment of the mA and/or kV according to patient size and/or use of iterative reconstruction technique. COMPARISON:  Head CT 01/25/2023. FINDINGS: CT HEAD FINDINGS Brain: There is periventricular white matter decreased attenuation consistent with small vessel ischemic changes. Ventricles, sulci and cisterns are prominent consistent with age related involutional changes. No acute intracranial hemorrhage, mass effect or shift. No hydrocephalus. Vascular: No hyperdense vessel or unexpected calcification. Skull: Normal. Negative for fracture or focal lesion. Sinuses/Orbits: No acute finding. CT CERVICAL SPINE FINDINGS Alignment: Normal. Skull base and vertebrae: No acute fracture. No primary bone lesion or focal pathologic process. Soft tissues and spinal canal: No prevertebral fluid or swelling. No visible canal hematoma. Disc levels: Disc space narrowing with sclerosis and marginal osteophytes consistent  with degenerative disc disease C4-C7. Facet joint osteoarthritic changes C3-4 through C7-T1. Upper chest: Biapical pleural/parenchymal changes likely representing scarring. IMPRESSION: 1. Atrophy and chronic small vessel ischemic changes. 2. No acute intracranial process identified. 3. Cervical degenerative changes. 4. No acute traumatic abnormalities of the cervical spine. Electronically Signed   By: Layla Maw M.D.   On: 01/27/2023 18:22   CT Cervical Spine Wo Contrast  Result Date: 01/27/2023 CLINICAL DATA:  Trauma. EXAM: CT HEAD WITHOUT CONTRAST CT CERVICAL SPINE WITHOUT CONTRAST TECHNIQUE: Multidetector CT imaging of the head and cervical spine was performed following the standard protocol without intravenous contrast. Multiplanar CT image reconstructions of the cervical spine were also generated. RADIATION DOSE REDUCTION: This exam was performed according to the departmental dose-optimization program which includes automated exposure control, adjustment of the mA and/or kV according to patient size and/or use of iterative reconstruction technique. COMPARISON:  Head CT 01/25/2023. FINDINGS: CT HEAD FINDINGS Brain: There is periventricular white matter decreased attenuation consistent with small vessel ischemic changes. Ventricles, sulci and cisterns are prominent consistent with age related involutional changes. No acute intracranial hemorrhage, mass effect or shift. No hydrocephalus. Vascular: No hyperdense vessel or unexpected calcification. Skull: Normal. Negative for fracture or focal lesion. Sinuses/Orbits: No acute finding. CT CERVICAL SPINE FINDINGS Alignment: Normal. Skull base and vertebrae: No acute fracture. No primary bone lesion or focal pathologic process. Soft tissues and spinal canal: No prevertebral fluid or swelling. No visible canal hematoma. Disc levels: Disc space narrowing with sclerosis and marginal osteophytes consistent with degenerative disc disease C4-C7. Facet joint  osteoarthritic changes C3-4 through C7-T1. Upper chest: Biapical pleural/parenchymal changes likely representing scarring. IMPRESSION: 1. Atrophy and chronic small vessel ischemic changes. 2. No acute intracranial process identified. 3. Cervical degenerative changes. 4. No acute traumatic abnormalities of the cervical spine. Electronically Signed   By: Layla Maw M.D.   On: 01/27/2023 18:22   DG Femur Min 2 Views Left  Result Date: 01/27/2023 CLINICAL DATA:  Fall, initial encounter.  Left hip and leg pain. EXAM: PELVIS - 1-2 VIEW; LEFT FEMUR 2 VIEWS COMPARISON:  None Available. FINDINGS: Pelvis: Displaced left inferior pubic ramus fracture. There is a mildly comminuted and displaced fracture the left superior ramus abutting the pubic body. Fracture may extend to the pubic symphysis. No definite disruption of sacral ala. Moderate  right hip osteoarthritis. Femur: Left hip arthroplasty in expected alignment. There is no periprosthetic lucency or fracture. The distal femur is intact. Knee alignment is maintained. No focal soft tissue abnormalities. IMPRESSION: 1. Displaced left inferior pubic ramus fracture. 2. Mildly comminuted and displaced fracture of the left superior pubic ramus abutting the pubic body. Fracture may extend to the pubic symphysis. 3. No fracture of the left hip or femur. Left hip arthroplasty is intact. Electronically Signed   By: Narda Rutherford M.D.   On: 01/27/2023 17:15   DG Pelvis 1-2 Views  Result Date: 01/27/2023 CLINICAL DATA:  Fall, initial encounter.  Left hip and leg pain. EXAM: PELVIS - 1-2 VIEW; LEFT FEMUR 2 VIEWS COMPARISON:  None Available. FINDINGS: Pelvis: Displaced left inferior pubic ramus fracture. There is a mildly comminuted and displaced fracture the left superior ramus abutting the pubic body. Fracture may extend to the pubic symphysis. No definite disruption of sacral ala. Moderate right hip osteoarthritis. Femur: Left hip arthroplasty in expected alignment.  There is no periprosthetic lucency or fracture. The distal femur is intact. Knee alignment is maintained. No focal soft tissue abnormalities. IMPRESSION: 1. Displaced left inferior pubic ramus fracture. 2. Mildly comminuted and displaced fracture of the left superior pubic ramus abutting the pubic body. Fracture may extend to the pubic symphysis. 3. No fracture of the left hip or femur. Left hip arthroplasty is intact. Electronically Signed   By: Narda Rutherford M.D.   On: 01/27/2023 17:15      Time coordinating discharge: over 30 minutes  SIGNED:  Sunnie Nielsen DO [facility]

## 2023-01-31 NOTE — Discharge Instructions (Signed)
Please give medications one at a time. Chocolate Ice Cream is best to get her to take the medications.

## 2023-01-31 NOTE — TOC Progression Note (Signed)
Transition of Care [facility]) - Progression Note    Patient Details  Name: Caitlyn Burns MRN: [id number] Date of Birth: [birth year]  Transition [facility]) CM/SW Contact  Garret Reddish, RN Phone Number: 01/31/2023, 10:35 AM  Clinical Narrative:   SNF authorization submitted.  SNF authorization approved.  Plan auth ID- [id number] and Auth ID- [id number].  Approval dates are 01-31-23- 02-04-2023.  Next review date is 02-04-2023.    I have informed Case Manager Meagan of the above information.    TOC will follow for discharge planning.        Barriers to Discharge: Barriers Resolved  Expected Discharge Plan and Services         Expected Discharge Date: 01/31/23                                     Social Determinants of Health (SDOH) Interventions SDOH Screenings   Food Insecurity: No Food Insecurity (01/27/2023)  Housing: Low Risk  (01/27/2023)  Transportation Needs: No Transportation Needs (01/27/2023)  Utilities: Not At Risk (01/27/2023)  Tobacco Use: Low Risk  (01/27/2023)    Readmission Risk Interventions     No data to display

## 2023-01-31 NOTE — TOC Transition Note (Addendum)
Transition of Care [facility]) - CM/SW Discharge Note   Patient Details  Name: Joleth Julich MRN: [id number] Date of Birth: 12-21-1930  Transition of Care [facility]) CM/SW Contact:  Liliana Cline, LCSW Phone Number: 01/31/2023, 9:26 AM   Clinical Narrative:    Discharge to [facility] today. Room 108.  Confirmed with Admissions Worker Sue Lush. Updated MD, LPN, and daughter [name].  Asked RN to call report. EMS paperwork completed.  11:20- [facility] called for transport. Patient is 2nd on the list. LPN notified.   Final next level of care: Skilled Nursing Facility Barriers to Discharge: Barriers Resolved   Patient Goals and CMS Choice      Discharge Placement                Patient chooses bed at: [facility] Patient to be transferred to facility by: [facility]      Discharge Plan and Services Additional resources added to the After Visit Summary for                                       Social Determinants of Health (SDOH) Interventions SDOH Screenings   Food Insecurity: No Food Insecurity (01/27/2023)  Housing: Low Risk  (01/27/2023)  Transportation Needs: No Transportation Needs (01/27/2023)  Utilities: Not At Risk (01/27/2023)  Tobacco Use: Low Risk  (01/27/2023)     Readmission Risk Interventions     No data to display

## 2023-01-31 NOTE — Care Management Important Message (Signed)
Important Message  Patient Details  Name: Caitlyn Burns MRN: [id number] Date of Birth: Jul 03, 1930   Medicare Important Message Given:  Yes     Olegario Messier A Kwali Wrinkle 01/31/2023, 10:52 AM

## 2023-01-31 NOTE — Progress Notes (Signed)
Called and provided report to [facility]. Report was given to [facility].

## 2023-02-03 ENCOUNTER — Encounter: Payer: Self-pay | Admitting: Student

## 2023-02-03 ENCOUNTER — Non-Acute Institutional Stay (SKILLED_NURSING_FACILITY): Payer: Medicare Other | Admitting: Student

## 2023-02-03 DIAGNOSIS — N1831 Chronic kidney disease, stage 3a: Secondary | ICD-10-CM

## 2023-02-03 DIAGNOSIS — E785 Hyperlipidemia, unspecified: Secondary | ICD-10-CM | POA: Insufficient documentation

## 2023-02-03 DIAGNOSIS — M810 Age-related osteoporosis without current pathological fracture: Secondary | ICD-10-CM | POA: Insufficient documentation

## 2023-02-03 DIAGNOSIS — I2510 Atherosclerotic heart disease of native coronary artery without angina pectoris: Secondary | ICD-10-CM

## 2023-02-03 DIAGNOSIS — I493 Ventricular premature depolarization: Secondary | ICD-10-CM | POA: Insufficient documentation

## 2023-02-03 DIAGNOSIS — F0390 Unspecified dementia without behavioral disturbance: Secondary | ICD-10-CM

## 2023-02-03 DIAGNOSIS — R296 Repeated falls: Secondary | ICD-10-CM

## 2023-02-03 DIAGNOSIS — S32512K Fracture of superior rim of left pubis, subsequent encounter for fracture with nonunion: Secondary | ICD-10-CM | POA: Diagnosis not present

## 2023-02-03 DIAGNOSIS — M353 Polymyalgia rheumatica: Secondary | ICD-10-CM | POA: Diagnosis not present

## 2023-02-03 DIAGNOSIS — I10 Essential (primary) hypertension: Secondary | ICD-10-CM

## 2023-02-03 DIAGNOSIS — I38 Endocarditis, valve unspecified: Secondary | ICD-10-CM | POA: Insufficient documentation

## 2023-02-03 NOTE — Progress Notes (Signed)
Provider:   Location:  Other Nursing Home Room Number: [facility] - [address] of Service:  SNF (31)  PCP: Lynnea Ferrier, MD Patient Care Team: Lynnea Ferrier, MD as PCP - General (Internal Medicine)  Extended Emergency Contact Information Primary Emergency Contact: Nieland,Rhonlee Mobile Phone: [phone] Relation: Spouse Secondary Emergency Contact: Arrie Aran Address: [address]          [place], [place] [address] Home Phone: [phone] Relation: Daughter  Code Status: DNR  Goals of Care: Advanced Directive information    01/27/2023    4:28 PM  Advanced Directives  Does Patient Have a Medical Advance Directive? Yes     Chief Complaint  Patient presents with   Acute Visit    Admission to [facility].     HPI: Patient is a 89 y.o. female seen today for admission to [facility] after a fall that led to a fracture. She has had some left sided pain, but it's improving.   In the hospital she had 3 more falls and broke her pelvis with one of those falls. She   She started to have memory decline when she was 70 years old. The children have been bringing meals to the home to help with her. Prior to this admission she hadn't had other falls in the home. Husband has been taking care of her since her diagnosis of memory changes.   The goal is to return home.   They are from [place], [place]. 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 She has been married for 72 years. She previously had a DNR with her PCP and family would like this continued.   Past Medical History:  Diagnosis Date   Arthritis    Hypertension    Leaky heart valve    Stroke (HCC)    tia   Past Surgical History:  Procedure Laterality Date   CARDIAC CATHETERIZATION     CATARACT EXTRACTION W/PHACO Left 04/23/2016   Procedure: CATARACT EXTRACTION PHACO AND INTRAOCULAR LENS PLACEMENT (IOC);  Surgeon: Galen Manila, MD;  Location: [facility] ORS;  Service: Ophthalmology;  Laterality: Left;  [place]  59.9 AP% 20.9 CDE 12.48 FLUID PACK LOT # [id number]   FRACTURE SURGERY     orif shoulder   JOINT REPLACEMENT     thr    reports that she has never smoked. She has never used smokeless tobacco. She reports that she does not drink alcohol and does not use drugs. Social History   Socioeconomic History   Marital status: Married    Spouse name: Not on file   Number of children: Not on file   Years of education: Not on file   Highest education level: Not on file  Occupational History   Not on file  Tobacco Use   Smoking status: Never   Smokeless tobacco: Never  Substance and Sexual Activity   Alcohol use: No   Drug use: Never   Sexual activity: Not Currently  Other Topics Concern   Not on file  Social History Narrative   Not on file   Social Determinants of Health   Financial Resource Strain: Not on file  Food Insecurity: No Food Insecurity (01/27/2023)   Hunger Vital Sign    Worried About Running Out of Food in the Last Year: Never true    Ran Out of Food in the Last Year: Never true  Transportation Needs: No Transportation Needs (01/27/2023)   PRAPARE - Transportation    Lack of Transportation (  Medical): No    Lack of Transportation (Non-Medical): No  Physical Activity: Not on file  Stress: Not on file  Social Connections: Not on file  Intimate Partner Violence: Not on file    Functional Status Survey:    History reviewed. No pertinent family history.  Health Maintenance  Topic Date Due   Medicare Annual Wellness (AWV)  Never done   Zoster Vaccines- Shingrix (1 of 2) Never done   DEXA SCAN  Never done   Pneumonia Vaccine 106+ Years old (2 of 2 - PCV) 02/04/2014   COVID-19 Vaccine (4 - 2023-24 season) 02/08/2022   INFLUENZA VACCINE  01/09/2023   DTaP/Tdap/Td (2 - Tdap) 06/20/2030   HPV VACCINES  Aged Out    Allergies  Allergen Reactions   Doxycycline Nausea And Vomiting   Tramadol     Outpatient Encounter Medications as of 02/03/2023  Medication Sig    acetaminophen (TYLENOL) 500 MG tablet Take 500 mg by mouth daily as needed for moderate pain or headache.   bisacodyl (DULCOLAX) 5 MG EC tablet Take 1 tablet (5 mg total) by mouth daily as needed for moderate constipation.   Calcium Carb-Cholecalciferol (CALCIUM 600+D) 600-800 MG-UNIT TABS Take 1 tablet by mouth daily.   clopidogrel (PLAVIX) 75 MG tablet Take 75 mg by mouth daily.   Cyanocobalamin (B-12 PO) Take 1 tablet by mouth daily at 12 noon.   diclofenac Sodium (VOLTAREN) 1 % GEL Apply 2 g topically 4 (four) times daily.   docusate sodium (COLACE) 100 MG capsule Take 1 capsule (100 mg total) by mouth 2 (two) times daily.   donepezil (ARICEPT) 5 MG tablet Take 5 mg by mouth daily.   HYDROcodone-acetaminophen (NORCO/VICODIN) 5-325 MG tablet Take 1-2 tablets by mouth every 6 (six) hours as needed for severe pain or moderate pain.   Ketotifen Fumarate (ALAWAY OP) Apply 1 drop to eye daily as needed (dry eyes).   Krill Oil 350 MG CAPS Take 1 capsule by mouth daily.   methocarbamol (ROBAXIN) 500 MG tablet Take 1 tablet (500 mg total) by mouth every 6 (six) hours as needed for muscle spasms.   Multiple Vitamins-Minerals (CENTRUM SILVER PO) Take 1 tablet by mouth daily.   polyethylene glycol (MIRALAX / GLYCOLAX) 17 g packet Take 17 g by mouth daily as needed for mild constipation.   pravastatin (PRAVACHOL) 20 MG tablet Take 20 mg by mouth at bedtime.   No facility-administered encounter medications on file as of 02/03/2023.    Review of Systems  Vitals:   02/03/23 0800  BP: (!) 128/57  Pulse: (!) 103  Resp: 20  Temp: 98.4 F (36.9 C)  SpO2: 97%  Weight: 106 lb 12.8 oz (48.4 kg)   Body mass index is 18.92 kg/m. Physical Exam  Labs reviewed: Basic Metabolic Panel: Recent Labs    01/27/23 1822 01/28/23 0421 01/29/23 0326  NA 139 140 136  K 3.9 3.9 3.9  CL 107 111 111  CO2 24 22 21*  GLUCOSE 168* 122* 111*  BUN 24* 23 19  CREATININE 0.90 0.76 0.73  CALCIUM 9.7 8.7* 8.1*    Liver Function Tests: No results for input(s): "AST", "ALT", "ALKPHOS", "BILITOT", "PROT", "ALBUMIN" in the last 8760 hours. No results for input(s): "LIPASE", "AMYLASE" in the last 8760 hours. No results for input(s): "AMMONIA" in the last 8760 hours. CBC: Recent Labs    01/25/23 1159 01/27/23 1822 01/28/23 0421 01/29/23 0326 01/30/23 0216 01/30/23 1323 01/31/23 0424  WBC 6.6 11.6* 9.8 8.3  --   --   --  NEUTROABS 4.8  --   --  5.8  --   --   --   HGB 12.2 11.7* 10.4* 9.7* 9.3* 10.3* 10.5*  HCT 37.1 34.4* 30.3* 27.9* 27.1* 30.8* 31.1*  MCV 96.9 94.2 92.7 92.4  --   --   --   PLT 172 152 140* 143*  --   --   --    Cardiac Enzymes: No results for input(s): "CKTOTAL", "CKMB", "CKMBINDEX", "TROPONINI" in the last 8760 hours. BNP: Invalid input(s): "POCBNP" No results found for: "HGBA1C" No results found for: "TSH" No results found for: "VITAMINB12" No results found for: "FOLATE" Lab Results  Component Value Date   IRON 56 01/30/2023   TIBC 203 (L) 01/30/2023   FERRITIN 372 (H) 01/30/2023    Imaging and Procedures obtained prior to SNF admission: CT PELVIS WO CONTRAST  Result Date: 01/27/2023 CLINICAL DATA:  89 year old female with dementia who presents after fall. Patient has fallen multiple times over the last few days. Evaluate fracture seen on radiograph 01/27/2023 EXAM: CT PELVIS WITHOUT CONTRAST TECHNIQUE: Multidetector CT imaging of the pelvis was performed following the standard protocol without intravenous contrast. RADIATION DOSE REDUCTION: This exam was performed according to the departmental dose-optimization program which includes automated exposure control, adjustment of the mA and/or kV according to patient size and/or use of iterative reconstruction technique. COMPARISON:  Radiograph 01/27/2023 FINDINGS: Urinary Tract:  No abnormality visualized. Bowel:  Unremarkable visualized pelvic bowel loops. Vascular/Lymphatic: Arterial atherosclerotic calcification. No  lymphadenopathy. Reproductive:  No acute abnormality. Other:  Small amount of blood in the pelvis. Musculoskeletal: Acute comminuted fracture of the left superior pubic ramus extending into the pubic body. Additional displaced fracture of the left inferior pubic ramus. Mildly displaced right sacral ala fractures. Left hip hemiarthroplasty. IMPRESSION: 1. Acute comminuted fracture of the left superior pubic ramus extending into the pubic body. 2. Mildly displaced fracture of the left inferior pubic ramus. 3. Mildly displaced right sacral ala fractures. 4. Small volume extraperitoneal blood in the pelvis. Electronically Signed   By: Minerva Fester M.D.   On: 01/27/2023 20:31   CT Head Wo Contrast  Result Date: 01/27/2023 CLINICAL DATA:  Trauma. EXAM: CT HEAD WITHOUT CONTRAST CT CERVICAL SPINE WITHOUT CONTRAST TECHNIQUE: Multidetector CT imaging of the head and cervical spine was performed following the standard protocol without intravenous contrast. Multiplanar CT image reconstructions of the cervical spine were also generated. RADIATION DOSE REDUCTION: This exam was performed according to the departmental dose-optimization program which includes automated exposure control, adjustment of the mA and/or kV according to patient size and/or use of iterative reconstruction technique. COMPARISON:  Head CT 01/25/2023. FINDINGS: CT HEAD FINDINGS Brain: There is periventricular white matter decreased attenuation consistent with small vessel ischemic changes. Ventricles, sulci and cisterns are prominent consistent with age related involutional changes. No acute intracranial hemorrhage, mass effect or shift. No hydrocephalus. Vascular: No hyperdense vessel or unexpected calcification. Skull: Normal. Negative for fracture or focal lesion. Sinuses/Orbits: No acute finding. CT CERVICAL SPINE FINDINGS Alignment: Normal. Skull base and vertebrae: No acute fracture. No primary bone lesion or focal pathologic process. Soft tissues  and spinal canal: No prevertebral fluid or swelling. No visible canal hematoma. Disc levels: Disc space narrowing with sclerosis and marginal osteophytes consistent with degenerative disc disease C4-C7. Facet joint osteoarthritic changes C3-4 through C7-T1. Upper chest: Biapical pleural/parenchymal changes likely representing scarring. IMPRESSION: 1. Atrophy and chronic small vessel ischemic changes. 2. No acute intracranial process identified. 3. Cervical degenerative changes. 4. No acute traumatic  abnormalities of the cervical spine. Electronically Signed   By: Layla Maw M.D.   On: 01/27/2023 18:22   CT Cervical Spine Wo Contrast  Result Date: 01/27/2023 CLINICAL DATA:  Trauma. EXAM: CT HEAD WITHOUT CONTRAST CT CERVICAL SPINE WITHOUT CONTRAST TECHNIQUE: Multidetector CT imaging of the head and cervical spine was performed following the standard protocol without intravenous contrast. Multiplanar CT image reconstructions of the cervical spine were also generated. RADIATION DOSE REDUCTION: This exam was performed according to the departmental dose-optimization program which includes automated exposure control, adjustment of the mA and/or kV according to patient size and/or use of iterative reconstruction technique. COMPARISON:  Head CT 01/25/2023. FINDINGS: CT HEAD FINDINGS Brain: There is periventricular white matter decreased attenuation consistent with small vessel ischemic changes. Ventricles, sulci and cisterns are prominent consistent with age related involutional changes. No acute intracranial hemorrhage, mass effect or shift. No hydrocephalus. Vascular: No hyperdense vessel or unexpected calcification. Skull: Normal. Negative for fracture or focal lesion. Sinuses/Orbits: No acute finding. CT CERVICAL SPINE FINDINGS Alignment: Normal. Skull base and vertebrae: No acute fracture. No primary bone lesion or focal pathologic process. Soft tissues and spinal canal: No prevertebral fluid or swelling. No  visible canal hematoma. Disc levels: Disc space narrowing with sclerosis and marginal osteophytes consistent with degenerative disc disease C4-C7. Facet joint osteoarthritic changes C3-4 through C7-T1. Upper chest: Biapical pleural/parenchymal changes likely representing scarring. IMPRESSION: 1. Atrophy and chronic small vessel ischemic changes. 2. No acute intracranial process identified. 3. Cervical degenerative changes. 4. No acute traumatic abnormalities of the cervical spine. Electronically Signed   By: Layla Maw M.D.   On: 01/27/2023 18:22   DG Femur Min 2 Views Left  Result Date: 01/27/2023 CLINICAL DATA:  Fall, initial encounter.  Left hip and leg pain. EXAM: PELVIS - 1-2 VIEW; LEFT FEMUR 2 VIEWS COMPARISON:  None Available. FINDINGS: Pelvis: Displaced left inferior pubic ramus fracture. There is a mildly comminuted and displaced fracture the left superior ramus abutting the pubic body. Fracture may extend to the pubic symphysis. No definite disruption of sacral ala. Moderate right hip osteoarthritis. Femur: Left hip arthroplasty in expected alignment. There is no periprosthetic lucency or fracture. The distal femur is intact. Knee alignment is maintained. No focal soft tissue abnormalities. IMPRESSION: 1. Displaced left inferior pubic ramus fracture. 2. Mildly comminuted and displaced fracture of the left superior pubic ramus abutting the pubic body. Fracture may extend to the pubic symphysis. 3. No fracture of the left hip or femur. Left hip arthroplasty is intact. Electronically Signed   By: Narda Rutherford M.D.   On: 01/27/2023 17:15   DG Pelvis 1-2 Views  Result Date: 01/27/2023 CLINICAL DATA:  Fall, initial encounter.  Left hip and leg pain. EXAM: PELVIS - 1-2 VIEW; LEFT FEMUR 2 VIEWS COMPARISON:  None Available. FINDINGS: Pelvis: Displaced left inferior pubic ramus fracture. There is a mildly comminuted and displaced fracture the left superior ramus abutting the pubic body. Fracture  may extend to the pubic symphysis. No definite disruption of sacral ala. Moderate right hip osteoarthritis. Femur: Left hip arthroplasty in expected alignment. There is no periprosthetic lucency or fracture. The distal femur is intact. Knee alignment is maintained. No focal soft tissue abnormalities. IMPRESSION: 1. Displaced left inferior pubic ramus fracture. 2. Mildly comminuted and displaced fracture of the left superior pubic ramus abutting the pubic body. Fracture may extend to the pubic symphysis. 3. No fracture of the left hip or femur. Left hip arthroplasty is intact. Electronically Signed  By: Narda Rutherford M.D.   On: 01/27/2023 17:15    Assessment/Plan 1. PMR (polymyalgia rheumatica) (HCC) Denies pain at this time. No medications. Continue to monitor.   2. Stage 3a chronic kidney disease (HCC) Stable on admission. BMP pending.  3. Dementia arising in the senium and presenium [facility]) Patient was previously ambulatory without a walker, now reliant on a walker. Discussed concern for progress with therapy given patient's dementia. She was independent in eating and feeding, however, adult children support for access to food and medication management. FAST stage 5 prior to recent hip fracture. Will continue with therapy to ascertain new baseline.   4. 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 Non-operative management at this time. Continue pain control with prn medications.   5. Coronary artery disease involving native coronary artery of native heart without angina pectoris Denies chest pain at this time. Continue to monitor.   6. Benign essential hypertension BP within normal range at this time. Some concern orthostasis contributed to falls. Encourage hydration. Qshift BP.   7. Frequent falls Numerous falls during hospitalization. Discussed concern for falls and risk of fractures. Continued supportive care at this time. Goal of RTH.    8. Osteoporosis, post-menopausal Frx during recent admission. Concern treatment of osteoporosis would be futile given age and functional status. Continue vitamin D and Calcium supplementation.    Family/ staff Communication: Spouse and children, nursing  Labs/tests ordered: CBC, BMP  I spent greater than 45 minutes for the care of this patient in face to face time, chart review, clinical documentation, patient education.

## 2023-02-07 MED ORDER — HYDROCODONE-ACETAMINOPHEN 5-325 MG PO TABS
1.0000 | ORAL_TABLET | Freq: Four times a day (QID) | ORAL | 0 refills | Status: DC | PRN
Start: 2023-02-07 — End: 2023-04-01

## 2023-02-07 NOTE — Progress Notes (Signed)
Pain medication for admission to rehab.

## 2023-02-09 ENCOUNTER — Telehealth: Payer: Self-pay | Admitting: Family

## 2023-02-09 NOTE — Telephone Encounter (Signed)
Call received from [facility] nurse states patient sustained a fall episode yesterday from standing position no injuries to head.request X-ray to re-evaluate recent left hip fracture.states patient has dementia unable verbalize where she hurts.order given to obtain left /right hip X-ray. Please follow up.

## 2023-02-11 NOTE — Telephone Encounter (Signed)
Xray negative without any ongoing reports of pain, mobility issues, pain or swelling.

## 2023-02-12 ENCOUNTER — Non-Acute Institutional Stay (SKILLED_NURSING_FACILITY): Payer: Medicare Other | Admitting: Student

## 2023-02-12 ENCOUNTER — Encounter: Payer: Self-pay | Admitting: Student

## 2023-02-12 DIAGNOSIS — F0390 Unspecified dementia without behavioral disturbance: Secondary | ICD-10-CM

## 2023-02-12 DIAGNOSIS — R296 Repeated falls: Secondary | ICD-10-CM | POA: Diagnosis not present

## 2023-02-12 NOTE — Progress Notes (Signed)
Location:  Other [facility] [place].  Nursing Home Room Number: [facility] [address]A Place of Service:  SNF [phone]) Provider:  Dr. Earnestine Mealing  PCP: Lynnea Ferrier, MD  Patient Care Team: Lynnea Ferrier, MD as PCP - General (Internal Medicine)  Extended Emergency Contact Information Primary Emergency Contact: [facility] Phone: [phone] Relation: Spouse Secondary Emergency Contact: Arrie Aran Address: [address]          [place], [place] [address] Home Phone: [phone] Relation: Daughter  Code Status:  DNR Goals of care: Advanced Directive information    02/12/2023   12:38 PM  Advanced Directives  Does Patient Have a Medical Advance Directive? Yes  Type of Advance Directive Out of facility DNR (pink MOST or yellow form)  Does patient want to make changes to medical advance directive? No - Patient declined     Chief Complaint  Patient presents with   Acute Visit    Fall    HPI:  Pt is a 89 y.o. female seen today for an acute visit for Fall  Disoriented. Daughter beside her. She doesn't remember the fall.   Unwitnessed fall. Nursing unable to identify injury at this time.   Discussed at IDT. Patient needs engagement during the day.  Past Medical History:  Diagnosis Date   Arthritis    Hypertension    Leaky heart valve    Stroke (HCC)    tia   Past Surgical History:  Procedure Laterality Date   CARDIAC CATHETERIZATION     CATARACT EXTRACTION W/PHACO Left 04/23/2016   Procedure: CATARACT EXTRACTION PHACO AND INTRAOCULAR LENS PLACEMENT (IOC);  Surgeon: Galen Manila, MD;  Location: [facility] ORS;  Service: Ophthalmology;  Laterality: Left;  [place] 59.9 AP% 20.9 CDE 12.48 FLUID PACK LOT # [id number]   FRACTURE SURGERY     orif shoulder   JOINT REPLACEMENT     thr    Allergies  Allergen Reactions   Doxycycline Nausea And Vomiting   Tramadol     Outpatient Encounter Medications as of 02/12/2023  Medication Sig   acetaminophen (TYLENOL) 500 MG  tablet Take 500 mg by mouth daily as needed for moderate pain or headache.   bisacodyl (DULCOLAX) 5 MG EC tablet Take 1 tablet (5 mg total) by mouth daily as needed for moderate constipation.   Calcium Carb-Cholecalciferol (CALCIUM 600+D) 600-800 MG-UNIT TABS Take 1 tablet by mouth daily.   clopidogrel (PLAVIX) 75 MG tablet Take 75 mg by mouth daily.   diclofenac Sodium (VOLTAREN) 1 % GEL Apply 2 g topically 4 (four) times daily.   docusate sodium (COLACE) 100 MG capsule Take 1 capsule (100 mg total) by mouth 2 (two) times daily.   donepezil (ARICEPT) 5 MG tablet Take 5 mg by mouth daily.   HYDROcodone-acetaminophen (NORCO/VICODIN) 5-325 MG tablet Take 1-2 tablets by mouth every 6 (six) hours as needed for severe pain or moderate pain.   Ketotifen Fumarate (ALAWAY OP) Apply 1 drop to eye daily as needed (dry eyes).   Krill Oil 350 MG CAPS Take 1 capsule by mouth daily.   Multiple Vitamins-Minerals (CENTRUM SILVER PO) Take 1 tablet by mouth daily.   polyethylene glycol (MIRALAX / GLYCOLAX) 17 g packet Take 17 g by mouth daily as needed for mild constipation.   pravastatin (PRAVACHOL) 20 MG tablet Take 20 mg by mouth at bedtime.   [DISCONTINUED] Cyanocobalamin (B-12 PO) Take 1 tablet by mouth daily at 12 noon.   [DISCONTINUED] methocarbamol (ROBAXIN) 500 MG tablet Take 1 tablet (  500 mg total) by mouth every 6 (six) hours as needed for muscle spasms.   No facility-administered encounter medications on file as of 02/12/2023.    Review of Systems  Immunization History  Administered Date(s) Administered   Influenza-Unspecified 04/08/2018, 02/09/2020, 04/25/2021, 05/24/2022   PFIZER Comirnaty(Gray Top)Covid-19 Tri-Sucrose Vaccine 07/01/2019, 07/22/2019, 07/24/2020   Pneumococcal Polysaccharide-23 02/04/2013   Td 06/20/2020   Pertinent  Health Maintenance Due  Topic Date Due   DEXA SCAN  Never done   INFLUENZA VACCINE  01/09/2023       No data to display         Functional Status  Survey:    Vitals:   02/12/23 1233 02/12/23 1241  BP: (!) 149/72 137/74  Pulse: 83   Resp: 14   Temp: (!) 97.5 F (36.4 C)   SpO2: 95%   Weight: 110 lb 6.4 oz (50.1 kg)   Height: 5\' 2"  (1.575 m)    Body mass index is 20.19 kg/m. Physical Exam Constitutional:      Appearance: Normal appearance.  HENT:     Head: Normocephalic and atraumatic.  Cardiovascular:     Rate and Rhythm: Normal rate and regular rhythm.     Pulses: Normal pulses.     Heart sounds: Normal heart sounds.  Pulmonary:     Effort: Pulmonary effort is normal.     Breath sounds: Normal breath sounds.  Abdominal:     General: Abdomen is flat.  Musculoskeletal:        General: Normal range of motion.  Skin:    General: Skin is warm and dry.     Findings: No bruising or lesion.  Neurological:     Mental Status: She is alert.     Labs reviewed: Recent Labs    01/27/23 1822 01/28/23 0421 01/29/23 0326  NA 139 140 136  K 3.9 3.9 3.9  CL 107 111 111  CO2 24 22 21*  GLUCOSE 168* 122* 111*  BUN 24* 23 19  CREATININE 0.90 0.76 0.73  CALCIUM 9.7 8.7* 8.1*   No results for input(s): "AST", "ALT", "ALKPHOS", "BILITOT", "PROT", "ALBUMIN" in the last 8760 hours. Recent Labs    01/25/23 1159 01/27/23 1822 01/28/23 0421 01/29/23 0326 01/30/23 0216 01/30/23 1323 01/31/23 0424  WBC 6.6 11.6* 9.8 8.3  --   --   --   NEUTROABS 4.8  --   --  5.8  --   --   --   HGB 12.2 11.7* 10.4* 9.7* 9.3* 10.3* 10.5*  HCT 37.1 34.4* 30.3* 27.9* 27.1* 30.8* 31.1*  MCV 96.9 94.2 92.7 92.4  --   --   --   PLT 172 152 140* 143*  --   --   --    No results found for: "TSH" No results found for: "HGBA1C" No results found for: "CHOL", "HDL", "LDLCALC", "LDLDIRECT", "TRIG", "CHOLHDL"  Significant Diagnostic Results in last 30 days:  CT PELVIS WO CONTRAST  Result Date: 01/27/2023 CLINICAL DATA:  89 year old female with dementia who presents after fall. Patient has fallen multiple times over the last few days. Evaluate  fracture seen on radiograph 01/27/2023 EXAM: CT PELVIS WITHOUT CONTRAST TECHNIQUE: Multidetector CT imaging of the pelvis was performed following the standard protocol without intravenous contrast. RADIATION DOSE REDUCTION: This exam was performed according to the departmental dose-optimization program which includes automated exposure control, adjustment of the mA and/or kV according to patient size and/or use of iterative reconstruction technique. COMPARISON:  Radiograph 01/27/2023 FINDINGS: Urinary Tract:  No abnormality visualized. Bowel:  Unremarkable visualized pelvic bowel loops. Vascular/Lymphatic: Arterial atherosclerotic calcification. No lymphadenopathy. Reproductive:  No acute abnormality. Other:  Small amount of blood in the pelvis. Musculoskeletal: Acute comminuted fracture of the left superior pubic ramus extending into the pubic body. Additional displaced fracture of the left inferior pubic ramus. Mildly displaced right sacral ala fractures. Left hip hemiarthroplasty. IMPRESSION: 1. Acute comminuted fracture of the left superior pubic ramus extending into the pubic body. 2. Mildly displaced fracture of the left inferior pubic ramus. 3. Mildly displaced right sacral ala fractures. 4. Small volume extraperitoneal blood in the pelvis. Electronically Signed   By: Minerva Fester M.D.   On: 01/27/2023 20:31   CT Head Wo Contrast  Result Date: 01/27/2023 CLINICAL DATA:  Trauma. EXAM: CT HEAD WITHOUT CONTRAST CT CERVICAL SPINE WITHOUT CONTRAST TECHNIQUE: Multidetector CT imaging of the head and cervical spine was performed following the standard protocol without intravenous contrast. Multiplanar CT image reconstructions of the cervical spine were also generated. RADIATION DOSE REDUCTION: This exam was performed according to the departmental dose-optimization program which includes automated exposure control, adjustment of the mA and/or kV according to patient size and/or use of iterative reconstruction  technique. COMPARISON:  Head CT 01/25/2023. FINDINGS: CT HEAD FINDINGS Brain: There is periventricular white matter decreased attenuation consistent with small vessel ischemic changes. Ventricles, sulci and cisterns are prominent consistent with age related involutional changes. No acute intracranial hemorrhage, mass effect or shift. No hydrocephalus. Vascular: No hyperdense vessel or unexpected calcification. Skull: Normal. Negative for fracture or focal lesion. Sinuses/Orbits: No acute finding. CT CERVICAL SPINE FINDINGS Alignment: Normal. Skull base and vertebrae: No acute fracture. No primary bone lesion or focal pathologic process. Soft tissues and spinal canal: No prevertebral fluid or swelling. No visible canal hematoma. Disc levels: Disc space narrowing with sclerosis and marginal osteophytes consistent with degenerative disc disease C4-C7. Facet joint osteoarthritic changes C3-4 through C7-T1. Upper chest: Biapical pleural/parenchymal changes likely representing scarring. IMPRESSION: 1. Atrophy and chronic small vessel ischemic changes. 2. No acute intracranial process identified. 3. Cervical degenerative changes. 4. No acute traumatic abnormalities of the cervical spine. Electronically Signed   By: Layla Maw M.D.   On: 01/27/2023 18:22   CT Cervical Spine Wo Contrast  Result Date: 01/27/2023 CLINICAL DATA:  Trauma. EXAM: CT HEAD WITHOUT CONTRAST CT CERVICAL SPINE WITHOUT CONTRAST TECHNIQUE: Multidetector CT imaging of the head and cervical spine was performed following the standard protocol without intravenous contrast. Multiplanar CT image reconstructions of the cervical spine were also generated. RADIATION DOSE REDUCTION: This exam was performed according to the departmental dose-optimization program which includes automated exposure control, adjustment of the mA and/or kV according to patient size and/or use of iterative reconstruction technique. COMPARISON:  Head CT 01/25/2023. FINDINGS: CT  HEAD FINDINGS Brain: There is periventricular white matter decreased attenuation consistent with small vessel ischemic changes. Ventricles, sulci and cisterns are prominent consistent with age related involutional changes. No acute intracranial hemorrhage, mass effect or shift. No hydrocephalus. Vascular: No hyperdense vessel or unexpected calcification. Skull: Normal. Negative for fracture or focal lesion. Sinuses/Orbits: No acute finding. CT CERVICAL SPINE FINDINGS Alignment: Normal. Skull base and vertebrae: No acute fracture. No primary bone lesion or focal pathologic process. Soft tissues and spinal canal: No prevertebral fluid or swelling. No visible canal hematoma. Disc levels: Disc space narrowing with sclerosis and marginal osteophytes consistent with degenerative disc disease C4-C7. Facet joint osteoarthritic changes C3-4 through C7-T1. Upper chest: Biapical pleural/parenchymal changes likely representing scarring. IMPRESSION:  1. Atrophy and chronic small vessel ischemic changes. 2. No acute intracranial process identified. 3. Cervical degenerative changes. 4. No acute traumatic abnormalities of the cervical spine. Electronically Signed   By: Layla Maw M.D.   On: 01/27/2023 18:22   DG Femur Min 2 Views Left  Result Date: 01/27/2023 CLINICAL DATA:  Fall, initial encounter.  Left hip and leg pain. EXAM: PELVIS - 1-2 VIEW; LEFT FEMUR 2 VIEWS COMPARISON:  None Available. FINDINGS: Pelvis: Displaced left inferior pubic ramus fracture. There is a mildly comminuted and displaced fracture the left superior ramus abutting the pubic body. Fracture may extend to the pubic symphysis. No definite disruption of sacral ala. Moderate right hip osteoarthritis. Femur: Left hip arthroplasty in expected alignment. There is no periprosthetic lucency or fracture. The distal femur is intact. Knee alignment is maintained. No focal soft tissue abnormalities. IMPRESSION: 1. Displaced left inferior pubic ramus fracture.  2. Mildly comminuted and displaced fracture of the left superior pubic ramus abutting the pubic body. Fracture may extend to the pubic symphysis. 3. No fracture of the left hip or femur. Left hip arthroplasty is intact. Electronically Signed   By: Narda Rutherford M.D.   On: 01/27/2023 17:15   DG Pelvis 1-2 Views  Result Date: 01/27/2023 CLINICAL DATA:  Fall, initial encounter.  Left hip and leg pain. EXAM: PELVIS - 1-2 VIEW; LEFT FEMUR 2 VIEWS COMPARISON:  None Available. FINDINGS: Pelvis: Displaced left inferior pubic ramus fracture. There is a mildly comminuted and displaced fracture the left superior ramus abutting the pubic body. Fracture may extend to the pubic symphysis. No definite disruption of sacral ala. Moderate right hip osteoarthritis. Femur: Left hip arthroplasty in expected alignment. There is no periprosthetic lucency or fracture. The distal femur is intact. Knee alignment is maintained. No focal soft tissue abnormalities. IMPRESSION: 1. Displaced left inferior pubic ramus fracture. 2. Mildly comminuted and displaced fracture of the left superior pubic ramus abutting the pubic body. Fracture may extend to the pubic symphysis. 3. No fracture of the left hip or femur. Left hip arthroplasty is intact. Electronically Signed   By: Narda Rutherford M.D.   On: 01/27/2023 17:15   DG Hip Unilat W or Wo Pelvis 2-3 Views Left  Result Date: 01/25/2023 CLINICAL DATA:  Fall 2 days ago with pelvic/left hip pain. EXAM: DG HIP (WITH OR WITHOUT PELVIS) 2-3V LEFT COMPARISON:  08/24/2007 FINDINGS: Moderate osteoarthritic change of the right hip. Mild degenerative change of the symphysis pubis joint. Minimal degenerative change of the sacroiliac joints and spine. Left hip arthroplasty intact and normally located. Possible subtle cortical step-off along the left side of the superior aspect of the symphysis. This may be acute or chronic. Remainder of the exam is unremarkable. IMPRESSION: 1. Possible subtle  cortical step-off along the left side of the superior aspect of the symphysis pubis joint which may be acute fracture versus sequelae from old injury. Consider CT for further evaluation. 2. Left hip arthroplasty intact and normally located. 3. Moderate osteoarthritic change of the right hip. Electronically Signed   By: Elberta Fortis M.D.   On: 01/25/2023 11:47   CT HEAD WO CONTRAST ( )  Result Date: 01/25/2023 CLINICAL DATA:  Fall on blood thinner.  History of dementia EXAM: CT HEAD WITHOUT CONTRAST TECHNIQUE: Contiguous axial images were obtained from the base of the skull through the vertex without intravenous contrast. RADIATION DOSE REDUCTION: This exam was performed according to the departmental dose-optimization program which includes automated exposure control, adjustment of the mA  and/or kV according to patient size and/or use of iterative reconstruction technique. COMPARISON:  Brain MRI 04/03/2021 FINDINGS: Brain: No evidence of acute infarction, hemorrhage, hydrocephalus, extra-axial collection or mass lesion/mass effect. Generalized brain atrophy correlating with history of dementia. Vascular: No hyperdense vessel or unexpected calcification. Skull: Normal. Negative for fracture or focal lesion. Sinuses/Orbits: No acute finding. IMPRESSION: No evidence of intracranial injury. Electronically Signed   By: Tiburcio Pea M.D.   On: 01/25/2023 11:23    Assessment/Plan Recurrent falls  Dementia arising in the senium and presenium [facility]) Patient amnestic to situation. Encourage request for support when performing self care. Continue current medications  Family/ staff Communication: daughter, nursing  Labs/tests ordered:  none

## 2023-02-24 ENCOUNTER — Non-Acute Institutional Stay (SKILLED_NURSING_FACILITY): Payer: Medicare Other | Admitting: Student

## 2023-02-24 ENCOUNTER — Encounter: Payer: Self-pay | Admitting: Student

## 2023-02-24 DIAGNOSIS — F03B0 Unspecified dementia, moderate, without behavioral disturbance, psychotic disturbance, mood disturbance, and anxiety: Secondary | ICD-10-CM | POA: Diagnosis not present

## 2023-02-24 NOTE — Progress Notes (Unsigned)
Location:  Other [place].  Nursing Home Room Number: [facility] [address] Place of Service:  SNF [phone]) Provider:  Dr. Earnestine Mealing  PCP: Lynnea Ferrier, MD  Patient Care Team: Lynnea Ferrier, MD as PCP - General (Internal Medicine)  Extended Emergency Contact Information Primary Emergency Contact: [facility] Phone: [phone] Relation: Spouse Secondary Emergency Contact: Arrie Aran Address: [address]          [place], [place] [address] Home Phone: [phone] Relation: Daughter  Code Status:  DNR Goals of care: Advanced Directive information    02/24/2023    3:57 PM  Advanced Directives  Does Patient Have a Medical Advance Directive? Yes  Type of Advance Directive Out of facility DNR (pink MOST or yellow form)  Does patient want to make changes to medical advance directive? No - Patient declined     Chief Complaint  Patient presents with   Acute Visit    Goals of Care.     HPI:  Pt is a 89 y.o. female seen today for an acute visit for Goals of Care.   Patient's daughter requests that patient have consultation to palliative care versus hospice. Discussed the differences between hospice and palliative care. At this time, she would like physical therapy to continue. She appreciates the clarification between the services. Discussed this is the appropriate time as patient has dementia which is progressing, but could have significant amount of time to live.   Past Medical History:  Diagnosis Date   Arthritis    Hypertension    Leaky heart valve    Stroke (HCC)    tia   Past Surgical History:  Procedure Laterality Date   CARDIAC CATHETERIZATION     CATARACT EXTRACTION W/PHACO Left 04/23/2016   Procedure: CATARACT EXTRACTION PHACO AND INTRAOCULAR LENS PLACEMENT (IOC);  Surgeon: Galen Manila, MD;  Location: [facility] ORS;  Service: Ophthalmology;  Laterality: Left;  [place] 59.9 AP% 20.9 CDE 12.48 FLUID PACK LOT # [id number]   FRACTURE SURGERY      orif shoulder   JOINT REPLACEMENT     thr    Allergies  Allergen Reactions   Doxycycline Nausea And Vomiting   Tramadol     Outpatient Encounter Medications as of 02/24/2023  Medication Sig   acetaminophen (TYLENOL) 500 MG tablet Take 500 mg by mouth daily as needed for moderate pain or headache.   bisacodyl (DULCOLAX) 5 MG EC tablet Take 1 tablet (5 mg total) by mouth daily as needed for moderate constipation.   Calcium Carb-Cholecalciferol (CALCIUM 600+D) 600-800 MG-UNIT TABS Take 1 tablet by mouth daily.   clopidogrel (PLAVIX) 75 MG tablet Take 75 mg by mouth daily.   diclofenac Sodium (VOLTAREN) 1 % GEL Apply 2 g topically 4 (four) times daily.   docusate sodium (COLACE) 100 MG capsule Take 1 capsule (100 mg total) by mouth 2 (two) times daily.   donepezil (ARICEPT) 5 MG tablet Take 5 mg by mouth daily.   HYDROcodone-acetaminophen (NORCO/VICODIN) 5-325 MG tablet Take 1-2 tablets by mouth every 6 (six) hours as needed for severe pain or moderate pain.   Ketotifen Fumarate (ALAWAY OP) Apply 1 drop to eye daily as needed (dry eyes).   Krill Oil 350 MG CAPS Take 1 capsule by mouth daily.   Multiple Vitamins-Minerals (CENTRUM SILVER PO) Take 1 tablet by mouth daily.   polyethylene glycol (MIRALAX / GLYCOLAX) 17 g packet Take 17 g by mouth daily as needed for mild constipation.   pravastatin (PRAVACHOL) 20  MG tablet Take 20 mg by mouth at bedtime.   Zinc Oxide (TRIPLE PASTE) 12.8 % ointment Apply 1 Application topically. Every shift.   No facility-administered encounter medications on file as of 02/24/2023.    Review of Systems  Immunization History  Administered Date(s) Administered   Influenza-Unspecified 04/08/2018, 02/09/2020, 04/25/2021, 05/24/2022   PFIZER Comirnaty(Gray Top)Covid-19 Tri-Sucrose Vaccine 07/01/2019, 07/22/2019, 07/24/2020   Pneumococcal Polysaccharide-23 02/04/2013   Td 06/20/2020   Pertinent  Health Maintenance Due  Topic Date Due   DEXA SCAN  Never done    INFLUENZA VACCINE  01/09/2023       No data to display         Functional Status Survey:    Vitals:   02/24/23 1553  BP: 116/69  Pulse: 80  Resp: 16  Temp: (!) 97.3 F (36.3 C)  SpO2: 97%  Weight: 112 lb (50.8 kg)  Height: 5\' 2"  (1.575 m)   Body mass index is 20.49 kg/m. Physical Exam  Labs reviewed: Recent Labs    01/27/23 1822 01/28/23 0421 01/29/23 0326  NA 139 140 136  K 3.9 3.9 3.9  CL 107 111 111  CO2 24 22 21*  GLUCOSE 168* 122* 111*  BUN 24* 23 19  CREATININE 0.90 0.76 0.73  CALCIUM 9.7 8.7* 8.1*   No results for input(s): "AST", "ALT", "ALKPHOS", "BILITOT", "PROT", "ALBUMIN" in the last 8760 hours. Recent Labs    01/25/23 1159 01/27/23 1822 01/28/23 0421 01/29/23 0326 01/30/23 0216 01/30/23 1323 01/31/23 0424  WBC 6.6 11.6* 9.8 8.3  --   --   --   NEUTROABS 4.8  --   --  5.8  --   --   --   HGB 12.2 11.7* 10.4* 9.7* 9.3* 10.3* 10.5*  HCT 37.1 34.4* 30.3* 27.9* 27.1* 30.8* 31.1*  MCV 96.9 94.2 92.7 92.4  --   --   --   PLT 172 152 140* 143*  --   --   --    No results found for: "TSH" No results found for: "HGBA1C" No results found for: "CHOL", "HDL", "LDLCALC", "LDLDIRECT", "TRIG", "CHOLHDL"  Significant Diagnostic Results in last 30 days:  CT PELVIS WO CONTRAST  Result Date: 01/27/2023 CLINICAL DATA:  89 year old female with dementia who presents after fall. Patient has fallen multiple times over the last few days. Evaluate fracture seen on radiograph 01/27/2023 EXAM: CT PELVIS WITHOUT CONTRAST TECHNIQUE: Multidetector CT imaging of the pelvis was performed following the standard protocol without intravenous contrast. RADIATION DOSE REDUCTION: This exam was performed according to the departmental dose-optimization program which includes automated exposure control, adjustment of the mA and/or kV according to patient size and/or use of iterative reconstruction technique. COMPARISON:  Radiograph 01/27/2023 FINDINGS: Urinary Tract:  No  abnormality visualized. Bowel:  Unremarkable visualized pelvic bowel loops. Vascular/Lymphatic: Arterial atherosclerotic calcification. No lymphadenopathy. Reproductive:  No acute abnormality. Other:  Small amount of blood in the pelvis. Musculoskeletal: Acute comminuted fracture of the left superior pubic ramus extending into the pubic body. Additional displaced fracture of the left inferior pubic ramus. Mildly displaced right sacral ala fractures. Left hip hemiarthroplasty. IMPRESSION: 1. Acute comminuted fracture of the left superior pubic ramus extending into the pubic body. 2. Mildly displaced fracture of the left inferior pubic ramus. 3. Mildly displaced right sacral ala fractures. 4. Small volume extraperitoneal blood in the pelvis. Electronically Signed   By: Minerva Fester M.D.   On: 01/27/2023 20:31   CT Head Wo Contrast  Result Date: 01/27/2023 CLINICAL  DATA:  Trauma. EXAM: CT HEAD WITHOUT CONTRAST CT CERVICAL SPINE WITHOUT CONTRAST TECHNIQUE: Multidetector CT imaging of the head and cervical spine was performed following the standard protocol without intravenous contrast. Multiplanar CT image reconstructions of the cervical spine were also generated. RADIATION DOSE REDUCTION: This exam was performed according to the departmental dose-optimization program which includes automated exposure control, adjustment of the mA and/or kV according to patient size and/or use of iterative reconstruction technique. COMPARISON:  Head CT 01/25/2023. FINDINGS: CT HEAD FINDINGS Brain: There is periventricular white matter decreased attenuation consistent with small vessel ischemic changes. Ventricles, sulci and cisterns are prominent consistent with age related involutional changes. No acute intracranial hemorrhage, mass effect or shift. No hydrocephalus. Vascular: No hyperdense vessel or unexpected calcification. Skull: Normal. Negative for fracture or focal lesion. Sinuses/Orbits: No acute finding. CT CERVICAL  SPINE FINDINGS Alignment: Normal. Skull base and vertebrae: No acute fracture. No primary bone lesion or focal pathologic process. Soft tissues and spinal canal: No prevertebral fluid or swelling. No visible canal hematoma. Disc levels: Disc space narrowing with sclerosis and marginal osteophytes consistent with degenerative disc disease C4-C7. Facet joint osteoarthritic changes C3-4 through C7-T1. Upper chest: Biapical pleural/parenchymal changes likely representing scarring. IMPRESSION: 1. Atrophy and chronic small vessel ischemic changes. 2. No acute intracranial process identified. 3. Cervical degenerative changes. 4. No acute traumatic abnormalities of the cervical spine. Electronically Signed   By: Layla Maw M.D.   On: 01/27/2023 18:22   CT Cervical Spine Wo Contrast  Result Date: 01/27/2023 CLINICAL DATA:  Trauma. EXAM: CT HEAD WITHOUT CONTRAST CT CERVICAL SPINE WITHOUT CONTRAST TECHNIQUE: Multidetector CT imaging of the head and cervical spine was performed following the standard protocol without intravenous contrast. Multiplanar CT image reconstructions of the cervical spine were also generated. RADIATION DOSE REDUCTION: This exam was performed according to the departmental dose-optimization program which includes automated exposure control, adjustment of the mA and/or kV according to patient size and/or use of iterative reconstruction technique. COMPARISON:  Head CT 01/25/2023. FINDINGS: CT HEAD FINDINGS Brain: There is periventricular white matter decreased attenuation consistent with small vessel ischemic changes. Ventricles, sulci and cisterns are prominent consistent with age related involutional changes. No acute intracranial hemorrhage, mass effect or shift. No hydrocephalus. Vascular: No hyperdense vessel or unexpected calcification. Skull: Normal. Negative for fracture or focal lesion. Sinuses/Orbits: No acute finding. CT CERVICAL SPINE FINDINGS Alignment: Normal. Skull base and  vertebrae: No acute fracture. No primary bone lesion or focal pathologic process. Soft tissues and spinal canal: No prevertebral fluid or swelling. No visible canal hematoma. Disc levels: Disc space narrowing with sclerosis and marginal osteophytes consistent with degenerative disc disease C4-C7. Facet joint osteoarthritic changes C3-4 through C7-T1. Upper chest: Biapical pleural/parenchymal changes likely representing scarring. IMPRESSION: 1. Atrophy and chronic small vessel ischemic changes. 2. No acute intracranial process identified. 3. Cervical degenerative changes. 4. No acute traumatic abnormalities of the cervical spine. Electronically Signed   By: Layla Maw M.D.   On: 01/27/2023 18:22   DG Femur Min 2 Views Left  Result Date: 01/27/2023 CLINICAL DATA:  Fall, initial encounter.  Left hip and leg pain. EXAM: PELVIS - 1-2 VIEW; LEFT FEMUR 2 VIEWS COMPARISON:  None Available. FINDINGS: Pelvis: Displaced left inferior pubic ramus fracture. There is a mildly comminuted and displaced fracture the left superior ramus abutting the pubic body. Fracture may extend to the pubic symphysis. No definite disruption of sacral ala. Moderate right hip osteoarthritis. Femur: Left hip arthroplasty in expected alignment. There is no  periprosthetic lucency or fracture. The distal femur is intact. Knee alignment is maintained. No focal soft tissue abnormalities. IMPRESSION: 1. Displaced left inferior pubic ramus fracture. 2. Mildly comminuted and displaced fracture of the left superior pubic ramus abutting the pubic body. Fracture may extend to the pubic symphysis. 3. No fracture of the left hip or femur. Left hip arthroplasty is intact. Electronically Signed   By: Narda Rutherford M.D.   On: 01/27/2023 17:15   DG Pelvis 1-2 Views  Result Date: 01/27/2023 CLINICAL DATA:  Fall, initial encounter.  Left hip and leg pain. EXAM: PELVIS - 1-2 VIEW; LEFT FEMUR 2 VIEWS COMPARISON:  None Available. FINDINGS: Pelvis:  Displaced left inferior pubic ramus fracture. There is a mildly comminuted and displaced fracture the left superior ramus abutting the pubic body. Fracture may extend to the pubic symphysis. No definite disruption of sacral ala. Moderate right hip osteoarthritis. Femur: Left hip arthroplasty in expected alignment. There is no periprosthetic lucency or fracture. The distal femur is intact. Knee alignment is maintained. No focal soft tissue abnormalities. IMPRESSION: 1. Displaced left inferior pubic ramus fracture. 2. Mildly comminuted and displaced fracture of the left superior pubic ramus abutting the pubic body. Fracture may extend to the pubic symphysis. 3. No fracture of the left hip or femur. Left hip arthroplasty is intact. Electronically Signed   By: Narda Rutherford M.D.   On: 01/27/2023 17:15    Assessment/Plan Moderate dementia without behavioral disturbance, psychotic disturbance, mood disturbance, or anxiety, unspecified dementia type (HCC) - Plan: Amb Referral to Palliative Care Patient with hx of dementia progressing. FAST stage 5 prior to admission, however, no additional needs given fracture. Progress with therapy is pending at this time.    Family/ staff Communication: nursing, daughter  Labs/tests ordered:  none

## 2023-03-10 ENCOUNTER — Non-Acute Institutional Stay (SKILLED_NURSING_FACILITY): Payer: Medicare Other | Admitting: Student

## 2023-03-10 ENCOUNTER — Encounter: Payer: Self-pay | Admitting: Student

## 2023-03-10 DIAGNOSIS — M353 Polymyalgia rheumatica: Secondary | ICD-10-CM | POA: Diagnosis not present

## 2023-03-10 DIAGNOSIS — F03B0 Unspecified dementia, moderate, without behavioral disturbance, psychotic disturbance, mood disturbance, and anxiety: Secondary | ICD-10-CM

## 2023-03-10 DIAGNOSIS — S32512K Fracture of superior rim of left pubis, subsequent encounter for fracture with nonunion: Secondary | ICD-10-CM | POA: Diagnosis not present

## 2023-03-10 DIAGNOSIS — I2510 Atherosclerotic heart disease of native coronary artery without angina pectoris: Secondary | ICD-10-CM

## 2023-03-10 DIAGNOSIS — R296 Repeated falls: Secondary | ICD-10-CM | POA: Diagnosis not present

## 2023-03-10 DIAGNOSIS — I10 Essential (primary) hypertension: Secondary | ICD-10-CM

## 2023-03-10 NOTE — Progress Notes (Unsigned)
Location:  Other [facility]) Nursing Home Room Number: [facility] [address]) Place of Service:  SNF [phone]) Provider: Dr. Karoline Caldwell, MD  Patient Care Team: Lynnea Ferrier, MD as PCP - General (Internal Medicine)  Extended Emergency Contact Information Primary Emergency Contact: [facility] Phone: [phone] Relation: Spouse Secondary Emergency Contact: Arrie Aran Address: [address]          [place], [place] [address] Home Phone: [phone] Relation: Daughter  Code Status:  DNR Goals of care: Advanced Directive information    03/10/2023   10:56 AM  Advanced Directives  Does Patient Have a Medical Advance Directive? Yes  Type of Advance Directive Out of facility DNR (pink MOST or yellow form)  Does patient want to make changes to medical advance directive? No - Patient declined     Chief Complaint  Patient presents with   Medical Management of Chronic Issues    Routine   Immunizations    Shingrix, Pneumonia, Covid   Quality Metric Gaps    Medicare Annual Wellness Visit and Dexa Scan    HPI:  Pt is a 89 y.o. female seen today for medical management of chronic diseases.    Patient is sitting doing an adult coloring book. She states she is doing fine. No concerns at this time.   Patient is scheduled to stay in facility for additional therapy. She has a hospice consult in place by family given her decline in function and advanced dementia.    Past Medical History:  Diagnosis Date   Arthritis    Hypertension    Leaky heart valve    Stroke (HCC)    tia   Past Surgical History:  Procedure Laterality Date   CARDIAC CATHETERIZATION     CATARACT EXTRACTION W/PHACO Left 04/23/2016   Procedure: CATARACT EXTRACTION PHACO AND INTRAOCULAR LENS PLACEMENT (IOC);  Surgeon: Galen Manila, MD;  Location: [facility] ORS;  Service: Ophthalmology;  Laterality: Left;  [place] 59.9 AP% 20.9 CDE 12.48 FLUID PACK LOT # [id number]   FRACTURE  SURGERY     orif shoulder   JOINT REPLACEMENT     thr    Allergies  Allergen Reactions   Doxycycline Nausea And Vomiting   Tramadol     Outpatient Encounter Medications as of 03/10/2023  Medication Sig   acetaminophen (TYLENOL) 500 MG tablet Take 500 mg by mouth daily as needed for moderate pain or headache.   bisacodyl (DULCOLAX) 5 MG EC tablet Take 1 tablet (5 mg total) by mouth daily as needed for moderate constipation.   Calcium Carb-Cholecalciferol (CALCIUM 600+D) 600-800 MG-UNIT TABS Take 1 tablet by mouth daily.   clopidogrel (PLAVIX) 75 MG tablet Take 75 mg by mouth daily.   diclofenac Sodium (VOLTAREN) 1 % GEL Apply 2 g topically 4 (four) times daily.   docusate sodium (COLACE) 100 MG capsule Take 1 capsule (100 mg total) by mouth 2 (two) times daily.   donepezil (ARICEPT) 5 MG tablet Take 5 mg by mouth daily.   HYDROcodone-acetaminophen (NORCO/VICODIN) 5-325 MG tablet Take 1-2 tablets by mouth every 6 (six) hours as needed for severe pain or moderate pain.   Ketotifen Fumarate (ALAWAY OP) Apply 1 drop to eye daily as needed (dry eyes).   Krill Oil 350 MG CAPS Take 1 capsule by mouth daily.   Multiple Vitamins-Minerals (CENTRUM SILVER PO) Take 1 tablet by mouth daily.   polyethylene glycol (MIRALAX / GLYCOLAX) 17 g packet Take 17 g by mouth daily  as needed for mild constipation.   pravastatin (PRAVACHOL) 20 MG tablet Take 20 mg by mouth at bedtime.   Zinc Oxide (TRIPLE PASTE) 12.8 % ointment Apply 1 Application topically. Every shift.   No facility-administered encounter medications on file as of 03/10/2023.    Review of Systems  Immunization History  Administered Date(s) Administered   Influenza-Unspecified 04/08/2018, 02/09/2020, 04/25/2021, 05/24/2022   PFIZER Comirnaty(Gray Top)Covid-19 Tri-Sucrose Vaccine 07/01/2019, 07/22/2019, 07/24/2020   Pneumococcal Polysaccharide-23 02/04/2013   Td 06/20/2020   Pertinent  Health Maintenance Due  Topic Date Due   DEXA SCAN   Never done   INFLUENZA VACCINE  01/09/2023       No data to display         Functional Status Survey:    Vitals:   03/10/23 1049  BP: 123/73  Pulse: 84  Resp: 16  Temp: 97.9 F (36.6 C)  SpO2: 95%  Weight: 112 lb (50.8 kg)  Height: 5\' 2"  (1.575 m)   Body mass index is 20.49 kg/m. Physical Exam Constitutional:      Comments: Thin with temporal muscle wasting  HENT:     Head: Normocephalic and atraumatic.  Cardiovascular:     Rate and Rhythm: Normal rate and regular rhythm.     Pulses: Normal pulses.  Pulmonary:     Effort: Pulmonary effort is normal.     Breath sounds: Normal breath sounds.  Neurological:     Mental Status: She is alert. She is disoriented.     Labs reviewed: Recent Labs    01/27/23 1822 01/28/23 0421 01/29/23 0326  NA 139 140 136  K 3.9 3.9 3.9  CL 107 111 111  CO2 24 22 21*  GLUCOSE 168* 122* 111*  BUN 24* 23 19  CREATININE 0.90 0.76 0.73  CALCIUM 9.7 8.7* 8.1*   No results for input(s): "AST", "ALT", "ALKPHOS", "BILITOT", "PROT", "ALBUMIN" in the last 8760 hours. Recent Labs    01/25/23 1159 01/27/23 1822 01/28/23 0421 01/29/23 0326 01/30/23 0216 01/30/23 1323 01/31/23 0424  WBC 6.6 11.6* 9.8 8.3  --   --   --   NEUTROABS 4.8  --   --  5.8  --   --   --   HGB 12.2 11.7* 10.4* 9.7* 9.3* 10.3* 10.5*  HCT 37.1 34.4* 30.3* 27.9* 27.1* 30.8* 31.1*  MCV 96.9 94.2 92.7 92.4  --   --   --   PLT 172 152 140* 143*  --   --   --    No results found for: "TSH" No results found for: "HGBA1C" No results found for: "CHOL", "HDL", "LDLCALC", "LDLDIRECT", "TRIG", "CHOLHDL"  Significant Diagnostic Results in last 30 days:  No results found.  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 Admitted after recurrent fall with pubic ramus injury. NO pain at this time. Memory decline is severe and she has experienced weight loss as a result. She is a FAST stage 6c at this time and is now admitted to hospice for supportive care. Continue aricept Patient denies pain from [facility]. BP well-controlled on current regimen. CAD continue plavix and statin therapy; no signs of bleeding at this  time. Continue GOC discussions.   Family/ staff Communication: nursing  Labs/tests ordered:  none

## 2023-04-01 ENCOUNTER — Non-Acute Institutional Stay (SKILLED_NURSING_FACILITY): Payer: Medicare Other | Admitting: Nurse Practitioner

## 2023-04-01 ENCOUNTER — Encounter: Payer: Self-pay | Admitting: Nurse Practitioner

## 2023-04-01 DIAGNOSIS — R296 Repeated falls: Secondary | ICD-10-CM | POA: Diagnosis not present

## 2023-04-01 DIAGNOSIS — F03B0 Unspecified dementia, moderate, without behavioral disturbance, psychotic disturbance, mood disturbance, and anxiety: Secondary | ICD-10-CM | POA: Diagnosis not present

## 2023-04-01 DIAGNOSIS — N1831 Chronic kidney disease, stage 3a: Secondary | ICD-10-CM | POA: Diagnosis not present

## 2023-04-01 DIAGNOSIS — I10 Essential (primary) hypertension: Secondary | ICD-10-CM

## 2023-04-01 DIAGNOSIS — I2510 Atherosclerotic heart disease of native coronary artery without angina pectoris: Secondary | ICD-10-CM

## 2023-04-01 NOTE — Progress Notes (Unsigned)
Location:  Other [facility].  Nursing Home Room Number: [facility] 112A Place of Service:  SNF [phone]) Abbey Chatters, NP  PCP: Lynnea Ferrier, MD  Patient Care Team: Lynnea Ferrier, MD as PCP - General (Internal Medicine)  Extended Emergency Contact Information Primary Emergency Contact: [facility] Phone: [phone] Relation: Spouse Secondary Emergency Contact: Arrie Aran Address: [address]          [place], [place] [address] Home Phone: [phone] Relation: Daughter  Goals of care: Advanced Directive information    04/01/2023    9:42 AM  Advanced Directives  Does Patient Have a Medical Advance Directive? Yes  Type of Advance Directive Out of facility DNR (pink MOST or yellow form)  Does patient want to make changes to medical advance directive? No - Patient declined     Chief Complaint  Patient presents with   Medical Management of Chronic Issues    Medical Management of Chronic Issues.     HPI:  Pt is a 89 y.o. female seen today for medical management of chronic disease.  Pt with hx of dementia, frequent falls who is at [facility] after hospitalization for ongoing falls.  Husband with her today and reports she is getting stronger but still tries to walk and then will fall.  She is pleasantly confused and denies pain at this time. No constipation reported Weight has been stable.   Past Medical History:  Diagnosis Date   Arthritis    Hypertension    Leaky heart valve    Stroke (HCC)    tia   Past Surgical History:  Procedure Laterality Date   CARDIAC CATHETERIZATION     CATARACT EXTRACTION W/PHACO Left 04/23/2016   Procedure: CATARACT EXTRACTION PHACO AND INTRAOCULAR LENS PLACEMENT (IOC);  Surgeon: Galen Manila, MD;  Location: [facility] ORS;  Service: Ophthalmology;  Laterality: Left;  [place] 59.9 AP% 20.9 CDE 12.48 FLUID PACK LOT # [id number]   FRACTURE SURGERY     orif shoulder   JOINT REPLACEMENT     thr    Allergies   Allergen Reactions   Doxycycline Nausea And Vomiting   Tramadol     Outpatient Encounter Medications as of 04/01/2023  Medication Sig   acetaminophen (TYLENOL) 500 MG tablet Take 500 mg by mouth daily as needed for moderate pain or headache.   bisacodyl (DULCOLAX) 5 MG EC tablet Take 1 tablet (5 mg total) by mouth daily as needed for moderate constipation.   Calcium Carb-Cholecalciferol (CALCIUM 600+D) 600-800 MG-UNIT TABS Take 1 tablet by mouth daily.   clopidogrel (PLAVIX) 75 MG tablet Take 75 mg by mouth daily.   diclofenac Sodium (VOLTAREN) 1 % GEL Apply 2 g topically 4 (four) times daily.   docusate sodium (COLACE) 100 MG capsule Take 1 capsule (100 mg total) by mouth 2 (two) times daily.   donepezil (ARICEPT) 5 MG tablet Take 5 mg by mouth daily.   HYDROcodone-acetaminophen (NORCO/VICODIN) 5-325 MG tablet Take 1 tablet by mouth every 6 (six) hours as needed for moderate pain (pain score 4-6).   Ketotifen Fumarate (ALAWAY OP) Apply 1 drop to eye daily as needed (dry eyes).   Krill Oil 350 MG CAPS Take 1 capsule by mouth daily.   Multiple Vitamins-Minerals (CENTRUM SILVER PO) Take 1 tablet by mouth daily.   polyethylene glycol (MIRALAX / GLYCOLAX) 17 g packet Take 17 g by mouth daily as needed for mild constipation.   pravastatin (PRAVACHOL) 20 MG tablet Take 20 mg by  mouth at bedtime.   Zinc Oxide (TRIPLE PASTE) 12.8 % ointment Apply 1 Application topically. Every shift.   [DISCONTINUED] HYDROcodone-acetaminophen (NORCO/VICODIN) 5-325 MG tablet Take 1-2 tablets by mouth every 6 (six) hours as needed for severe pain or moderate pain. (Patient taking differently: Take 1 tablet by mouth every 6 (six) hours as needed for severe pain (pain score 7-10) or moderate pain (pain score 4-6).)   No facility-administered encounter medications on file as of 04/01/2023.    Review of Systems  Unable to perform ROS: Dementia     Immunization History  Administered Date(s) Administered    Influenza-Unspecified 04/08/2018, 02/09/2020, 04/25/2021, 05/24/2022   PFIZER Comirnaty(Gray Top)Covid-19 Tri-Sucrose Vaccine 07/01/2019, 07/22/2019, 07/24/2020   Pneumococcal Polysaccharide-23 02/04/2013   Td 06/20/2020   Pertinent  Health Maintenance Due  Topic Date Due   DEXA SCAN  Never done   INFLUENZA VACCINE  01/09/2023       No data to display         Functional Status Survey:    Vitals:   04/01/23 0936 04/01/23 0943  BP: (!) 170/92 134/65  Pulse: 79   Resp: 16   Temp: (!) 97.3 F (36.3 C)   SpO2: 95%   Weight: 114 lb 3.2 oz (51.8 kg)   Height: 5\' 2"  (1.575 m)    Body mass index is 20.89 kg/m. Physical Exam Constitutional:      General: She is not in acute distress.    Appearance: She is well-developed. She is not diaphoretic.  HENT:     Head: Normocephalic and atraumatic.     Mouth/Throat:     Pharynx: No oropharyngeal exudate.  Eyes:     Conjunctiva/sclera: Conjunctivae normal.     Pupils: Pupils are equal, round, and reactive to light.  Cardiovascular:     Rate and Rhythm: Normal rate and regular rhythm.     Heart sounds: Normal heart sounds.  Pulmonary:     Effort: Pulmonary effort is normal.     Breath sounds: Normal breath sounds.  Abdominal:     General: Bowel sounds are normal.     Palpations: Abdomen is soft.  Musculoskeletal:     Cervical back: Normal range of motion and neck supple.     Right lower leg: No edema.     Left lower leg: No edema.  Skin:    General: Skin is warm and dry.  Neurological:     Mental Status: She is alert. Mental status is at baseline. She is disoriented.     Motor: Weakness present.     Gait: Gait abnormal.  Psychiatric:        Mood and Affect: Mood normal.     Labs reviewed: Recent Labs    01/27/23 1822 01/28/23 0421 01/29/23 0326  NA 139 140 136  K 3.9 3.9 3.9  CL 107 111 111  CO2 24 22 21*  GLUCOSE 168* 122* 111*  BUN 24* 23 19  CREATININE 0.90 0.76 0.73  CALCIUM 9.7 8.7* 8.1*   No results  for input(s): "AST", "ALT", "ALKPHOS", "BILITOT", "PROT", "ALBUMIN" in the last 8760 hours. Recent Labs    01/25/23 1159 01/27/23 1822 01/28/23 0421 01/29/23 0326 01/30/23 0216 01/30/23 1323 01/31/23 0424  WBC 6.6 11.6* 9.8 8.3  --   --   --   NEUTROABS 4.8  --   --  5.8  --   --   --   HGB 12.2 11.7* 10.4* 9.7* 9.3* 10.3* 10.5*  HCT 37.1 34.4* 30.3* 27.9*  27.1* 30.8* 31.1*  MCV 96.9 94.2 92.7 92.4  --   --   --   PLT 172 152 140* 143*  --   --   --    No results found for: "TSH" No results found for: "HGBA1C" No results found for: "CHOL", "HDL", "LDLCALC", "LDLDIRECT", "TRIG", "CHOLHDL"  Significant Diagnostic Results in last 30 days:  No results found.  Assessment/Plan 1. Benign essential hypertension -Blood pressure well controlled, goal bp <140/90 Continue current medications and dietary modifications follow metabolic panel  2. Recurrent falls -ongoing, fall precautions.   3. Moderate dementia without behavioral disturbance, psychotic disturbance, mood disturbance, or anxiety, unspecified dementia type (HCC) -Stable, no acute changes in cognitive or functional status, continue supportive care.   4. Stage 3a chronic kidney disease (HCC) -Chronic and stable Encourage proper hydration Follow metabolic panel Avoid nephrotoxic meds (NSAIDS)  5. Coronary artery disease involving native coronary artery of native heart without angina pectoris Without chest pain, continues on plavix and statin    Jessica K. Biagio Borg [facility] [phone]

## 2023-04-09 ENCOUNTER — Non-Acute Institutional Stay (SKILLED_NURSING_FACILITY): Payer: Medicare Other | Admitting: Student

## 2023-04-09 ENCOUNTER — Encounter: Payer: Self-pay | Admitting: Student

## 2023-04-09 DIAGNOSIS — S32512K Fracture of superior rim of left pubis, subsequent encounter for fracture with nonunion: Secondary | ICD-10-CM | POA: Diagnosis not present

## 2023-04-09 NOTE — Progress Notes (Signed)
Location:  Other Nursing Home Room Number: [facility] 112A Place of Service:  SNF [phone]) Provider:  Forrest Moron, Viona Gilmore, MD  Patient Care Team: Lynnea Ferrier, MD as PCP - General (Internal Medicine)  Extended Emergency Contact Information Primary Emergency Contact: Veazie,Rhonlee Mobile Phone: [phone] Relation: Spouse Secondary Emergency Contact: Arrie Aran Address: [address]          [place], [place] [address] Home Phone: [phone] Relation: Daughter  Code Status:  DNR Goals of care: Advanced Directive information    04/01/2023    9:42 AM  Advanced Directives  Does Patient Have a Medical Advance Directive? Yes  Type of Advance Directive Out of facility DNR (pink MOST or yellow form)  Does patient want to make changes to medical advance directive? No - Patient declined     Chief Complaint  Patient presents with   Imaging results    HPI:  Pt is a 89 y.o. female seen today for an acute visit for follow up of imaging study. Patient was seen after a fall. Denies pain at this time. She is doing well. She often colors pictures on the hall.    Past Medical History:  Diagnosis Date   Arthritis    Hypertension    Leaky heart valve    Stroke (HCC)    tia   Past Surgical History:  Procedure Laterality Date   CARDIAC CATHETERIZATION     CATARACT EXTRACTION W/PHACO Left 04/23/2016   Procedure: CATARACT EXTRACTION PHACO AND INTRAOCULAR LENS PLACEMENT (IOC);  Surgeon: Galen Manila, MD;  Location: [facility] ORS;  Service: Ophthalmology;  Laterality: Left;  [place] 59.9 AP% 20.9 CDE 12.48 FLUID PACK LOT # [id number] H   FRACTURE SURGERY     orif shoulder   JOINT REPLACEMENT     thr    Allergies  Allergen Reactions   Doxycycline Nausea And Vomiting   Tramadol     Outpatient Encounter Medications as of 04/09/2023  Medication Sig   acetaminophen (TYLENOL) 500 MG tablet Take 500 mg by mouth daily as needed for moderate pain or headache.   bisacodyl  (DULCOLAX) 5 MG EC tablet Take 1 tablet (5 mg total) by mouth daily as needed for moderate constipation.   Calcium Carb-Cholecalciferol (CALCIUM 600+D) 600-800 MG-UNIT TABS Take 1 tablet by mouth daily.   clopidogrel (PLAVIX) 75 MG tablet Take 75 mg by mouth daily.   diclofenac Sodium (VOLTAREN) 1 % GEL Apply 2 g topically 4 (four) times daily.   docusate sodium (COLACE) 100 MG capsule Take 1 capsule (100 mg total) by mouth 2 (two) times daily.   donepezil (ARICEPT) 5 MG tablet Take 5 mg by mouth daily.   HYDROcodone-acetaminophen (NORCO/VICODIN) 5-325 MG tablet Take 1 tablet by mouth every 6 (six) hours as needed for moderate pain (pain score 4-6).   Ketotifen Fumarate (ALAWAY OP) Apply 1 drop to eye daily as needed (dry eyes).   Krill Oil 350 MG CAPS Take 1 capsule by mouth daily.   Multiple Vitamins-Minerals (CENTRUM SILVER PO) Take 1 tablet by mouth daily.   polyethylene glycol (MIRALAX / GLYCOLAX) 17 g packet Take 17 g by mouth daily as needed for mild constipation.   pravastatin (PRAVACHOL) 20 MG tablet Take 20 mg by mouth at bedtime.   Zinc Oxide (TRIPLE PASTE) 12.8 % ointment Apply 1 Application topically. Every shift.   No facility-administered encounter medications on file as of 04/09/2023.    Review of Systems  Immunization History  Administered Date(s) Administered  Influenza-Unspecified 04/08/2018, 02/09/2020, 04/25/2021, 05/24/2022   PFIZER Comirnaty(Gray Top)Covid-19 Tri-Sucrose Vaccine 07/01/2019, 07/22/2019, 07/24/2020   Pneumococcal Polysaccharide-23 02/04/2013   Td 06/20/2020   Pertinent  Health Maintenance Due  Topic Date Due   DEXA SCAN  Never done   INFLUENZA VACCINE  01/09/2023       No data to display         Functional Status Survey:    There were no vitals filed for this visit. There is no height or weight on file to calculate BMI. Physical Exam Neurological:     Mental Status: She is alert. Mental status is at baseline. She is disoriented.      Labs reviewed: Recent Labs    01/27/23 1822 01/28/23 0421 01/29/23 0326  NA 139 140 136  K 3.9 3.9 3.9  CL 107 111 111  CO2 24 22 21*  GLUCOSE 168* 122* 111*  BUN 24* 23 19  CREATININE 0.90 0.76 0.73  CALCIUM 9.7 8.7* 8.1*   No results for input(s): "AST", "ALT", "ALKPHOS", "BILITOT", "PROT", "ALBUMIN" in the last 8760 hours. Recent Labs    01/25/23 1159 01/27/23 1822 01/28/23 0421 01/29/23 0326 01/30/23 0216 01/30/23 1323 01/31/23 0424  WBC 6.6 11.6* 9.8 8.3  --   --   --   NEUTROABS 4.8  --   --  5.8  --   --   --   HGB 12.2 11.7* 10.4* 9.7* 9.3* 10.3* 10.5*  HCT 37.1 34.4* 30.3* 27.9* 27.1* 30.8* 31.1*  MCV 96.9 94.2 92.7 92.4  --   --   --   PLT 172 152 140* 143*  --   --   --    No results found for: "TSH" No results found for: "HGBA1C" No results found for: "CHOL", "HDL", "LDLCALC", "LDLDIRECT", "TRIG", "CHOLHDL"  Significant Diagnostic Results in last 30 days:  No results found.  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 X-ray on 9/1 says it was negative for fracture and 10/29 says fracture is present. Requested re-analysis of hip imaging as patient was admitted with left pubic ramus fractures on 8/26 after an injurous fall on 8/18. At this point, based on x-rays patient has not had a new injury. She will follow up with orthopedics to determine next steps for management which will likely be non-surgical given CT scan on 8/18.  Family/ staff Communication: Nursing  Labs/tests ordered:  none  I spent greater than 30 minutes for the care of this patient in face to face time, chart review, clinical documentation,  Care coordination, patient education.

## 2023-04-10 LAB — BASIC METABOLIC PANEL
BUN: 14 (ref 4–21)
CO2: 28 — AB (ref 13–22)
Chloride: 102 (ref 99–108)
Creatinine: 0.8 (ref 0.5–1.1)
Glucose: 98
Potassium: 3.9 meq/L (ref 3.5–5.1)
Sodium: 138 (ref 137–147)

## 2023-04-10 LAB — COMPREHENSIVE METABOLIC PANEL
Calcium: 9 (ref 8.7–10.7)
eGFR: 72

## 2023-05-05 ENCOUNTER — Non-Acute Institutional Stay (SKILLED_NURSING_FACILITY): Payer: Medicare Other | Admitting: Student

## 2023-05-05 DIAGNOSIS — M810 Age-related osteoporosis without current pathological fracture: Secondary | ICD-10-CM

## 2023-05-05 DIAGNOSIS — I2510 Atherosclerotic heart disease of native coronary artery without angina pectoris: Secondary | ICD-10-CM

## 2023-05-05 DIAGNOSIS — R296 Repeated falls: Secondary | ICD-10-CM

## 2023-05-05 DIAGNOSIS — I10 Essential (primary) hypertension: Secondary | ICD-10-CM | POA: Diagnosis not present

## 2023-05-05 DIAGNOSIS — S32512K Fracture of superior rim of left pubis, subsequent encounter for fracture with nonunion: Secondary | ICD-10-CM | POA: Diagnosis not present

## 2023-05-05 DIAGNOSIS — F03B0 Unspecified dementia, moderate, without behavioral disturbance, psychotic disturbance, mood disturbance, and anxiety: Secondary | ICD-10-CM | POA: Diagnosis not present

## 2023-05-05 NOTE — Progress Notes (Unsigned)
Location:  Other Nursing Home Room Number: [facility] 112A Place of Service:  SNF [phone]) Provider:  Forrest Moron, Viona Gilmore, MD  Patient Care Team: Lynnea Ferrier, MD as PCP - General (Internal Medicine)  Extended Emergency Contact Information Primary Emergency Contact: Troy,Rhonlee Mobile Phone: [phone] Relation: Spouse Secondary Emergency Contact: Arrie Aran Address: [address]          [place], [place] [address] Home Phone: [phone] Relation: Daughter  Code Status:  DNR Goals of care: Advanced Directive information    04/01/2023    9:42 AM  Advanced Directives  Does Patient Have a Medical Advance Directive? Yes  Type of Advance Directive Out of facility DNR (pink MOST or yellow form)  Does patient want to make changes to medical advance directive? No - Patient declined     Chief Complaint  Patient presents with   Medical Management of Chronic Conditions    HPI:  Caitlyn Burns is a 89 y.o. female seen today for medical management of chronic diseases.    Patient without concerns at this time. She often sits at the nursing station interacting with staff. She spends time coloring and watching [employer]. She often tries to get up from her chair without assistance.   Nursing without acute concerns at this time.   Past Medical History:  Diagnosis Date   Arthritis    Hypertension    Leaky heart valve    Stroke (HCC)    tia   Past Surgical History:  Procedure Laterality Date   CARDIAC CATHETERIZATION     CATARACT EXTRACTION W/PHACO Left 04/23/2016   Procedure: CATARACT EXTRACTION PHACO AND INTRAOCULAR LENS PLACEMENT (IOC);  Surgeon: Galen Manila, MD;  Location: [facility] ORS;  Service: Ophthalmology;  Laterality: Left;  [place] 59.9 AP% 20.9 CDE 12.48 FLUID PACK LOT # [id number] H   FRACTURE SURGERY     orif shoulder   JOINT REPLACEMENT     thr    Allergies  Allergen Reactions   Doxycycline Nausea And Vomiting   Tramadol     Outpatient Encounter  Medications as of 05/05/2023  Medication Sig   acetaminophen (TYLENOL) 500 MG tablet Take 500 mg by mouth daily as needed for moderate pain or headache.   bisacodyl (DULCOLAX) 5 MG EC tablet Take 1 tablet (5 mg total) by mouth daily as needed for moderate constipation.   Calcium Carb-Cholecalciferol (CALCIUM 600+D) 600-800 MG-UNIT TABS Take 1 tablet by mouth daily.   clopidogrel (PLAVIX) 75 MG tablet Take 75 mg by mouth daily.   diclofenac Sodium (VOLTAREN) 1 % GEL Apply 2 g topically 4 (four) times daily.   docusate sodium (COLACE) 100 MG capsule Take 1 capsule (100 mg total) by mouth 2 (two) times daily.   donepezil (ARICEPT) 5 MG tablet Take 5 mg by mouth daily.   HYDROcodone-acetaminophen (NORCO/VICODIN) 5-325 MG tablet Take 1 tablet by mouth every 6 (six) hours as needed for moderate pain (pain score 4-6).   Ketotifen Fumarate (ALAWAY OP) Apply 1 drop to eye daily as needed (dry eyes).   Krill Oil 350 MG CAPS Take 1 capsule by mouth daily.   Multiple Vitamins-Minerals (CENTRUM SILVER PO) Take 1 tablet by mouth daily.   polyethylene glycol (MIRALAX / GLYCOLAX) 17 g packet Take 17 g by mouth daily as needed for mild constipation.   pravastatin (PRAVACHOL) 20 MG tablet Take 20 mg by mouth at bedtime.   Zinc Oxide (TRIPLE PASTE) 12.8 % ointment Apply 1 Application topically. Every shift.  No facility-administered encounter medications on file as of 05/05/2023.    Review of Systems  Immunization History  Administered Date(s) Administered   Influenza-Unspecified 04/08/2018, 02/09/2020, 04/25/2021, 05/24/2022   PFIZER Comirnaty(Gray Top)Covid-19 Tri-Sucrose Vaccine 07/01/2019, 07/22/2019, 07/24/2020   Pneumococcal Polysaccharide-23 02/04/2013   Td 06/20/2020   Pertinent  Health Maintenance Due  Topic Date Due   DEXA SCAN  Never done   INFLUENZA VACCINE  01/09/2023       No data to display         Functional Status Survey:    Vitals:   05/05/23 0922 05/06/23 0923  BP: (!)  151/72 126/76  Pulse: 78   Resp: 16   SpO2: 95%   Weight: 102 lb 12.8 oz (46.6 kg)    Body mass index is 18.8 kg/m. Physical Exam Constitutional:      Comments: Thin, frail, elderly  Cardiovascular:     Rate and Rhythm: Normal rate.     Pulses: Normal pulses.  Pulmonary:     Effort: Pulmonary effort is normal.  Skin:    General: Skin is warm and dry.  Neurological:     Mental Status: She is alert.     Labs reviewed: Recent Labs    01/27/23 1822 01/28/23 0421 01/29/23 0326  NA 139 140 136  K 3.9 3.9 3.9  CL 107 111 111  CO2 24 22 21*  GLUCOSE 168* 122* 111*  BUN 24* 23 19  CREATININE 0.90 0.76 0.73  CALCIUM 9.7 8.7* 8.1*   No results for input(s): "AST", "ALT", "ALKPHOS", "BILITOT", "PROT", "ALBUMIN" in the last 8760 hours. Recent Labs    01/25/23 1159 01/27/23 1822 01/28/23 0421 01/29/23 0326 01/30/23 0216 01/30/23 1323 01/31/23 0424  WBC 6.6 11.6* 9.8 8.3  --   --   --   NEUTROABS 4.8  --   --  5.8  --   --   --   HGB 12.2 11.7* 10.4* 9.7* 9.3* 10.3* 10.5*  HCT 37.1 34.4* 30.3* 27.9* 27.1* 30.8* 31.1*  MCV 96.9 94.2 92.7 92.4  --   --   --   PLT 172 152 140* 143*  --   --   --    No results found for: "TSH" No results found for: "HGBA1C" No results found for: "CHOL", "HDL", "LDLCALC", "LDLDIRECT", "TRIG", "CHOLHDL"  Significant Diagnostic Results in last 30 days:  No results found.  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 She did not progress well with physical therapy due to underlying dementia and family decided  patient should remain in facility for long term care. Continue aricept for dementia. No pain at this time. BP well-controlled with losartan at this time. Continue plavix and statin given CAD. Discontinue norco. Continue goals of care conversations given history of frequent falls with fracture. Continue vitamin D and calcium given osteoporosis.   Family/ staff Communication: nursing  Labs/tests ordered:  none

## 2023-05-06 ENCOUNTER — Encounter: Payer: Self-pay | Admitting: Student

## 2023-05-07 LAB — CBC AND DIFFERENTIAL
HCT: 37 (ref 36–46)
Hemoglobin: 12.2 (ref 12.0–16.0)
Neutrophils Absolute: 3726
Platelets: 278 10*3/uL (ref 150–400)
WBC: 6

## 2023-05-07 LAB — HEPATIC FUNCTION PANEL
ALT: 12 U/L (ref 7–35)
AST: 21 (ref 13–35)
Alkaline Phosphatase: 82 (ref 25–125)
Bilirubin, Total: 0.5

## 2023-05-07 LAB — LIPID PANEL
Cholesterol: 144 (ref 0–200)
HDL: 62 (ref 35–70)
LDL Cholesterol: 70
Triglycerides: 50 (ref 40–160)

## 2023-05-07 LAB — COMPREHENSIVE METABOLIC PANEL
Albumin: 3.3 — AB (ref 3.5–5.0)
Globulin: 2

## 2023-05-07 LAB — VITAMIN B12: Vitamin B-12: 804

## 2023-05-07 LAB — CBC: RBC: 3.87 (ref 3.87–5.11)

## 2023-06-19 ENCOUNTER — Encounter: Payer: Self-pay | Admitting: Nurse Practitioner

## 2023-06-19 ENCOUNTER — Non-Acute Institutional Stay (SKILLED_NURSING_FACILITY): Payer: Medicare Other | Admitting: Nurse Practitioner

## 2023-06-19 DIAGNOSIS — I2510 Atherosclerotic heart disease of native coronary artery without angina pectoris: Secondary | ICD-10-CM | POA: Diagnosis not present

## 2023-06-19 DIAGNOSIS — N1831 Chronic kidney disease, stage 3a: Secondary | ICD-10-CM

## 2023-06-19 DIAGNOSIS — R296 Repeated falls: Secondary | ICD-10-CM | POA: Diagnosis not present

## 2023-06-19 DIAGNOSIS — I10 Essential (primary) hypertension: Secondary | ICD-10-CM | POA: Diagnosis not present

## 2023-06-19 DIAGNOSIS — F03B0 Unspecified dementia, moderate, without behavioral disturbance, psychotic disturbance, mood disturbance, and anxiety: Secondary | ICD-10-CM | POA: Diagnosis not present

## 2023-06-19 DIAGNOSIS — M810 Age-related osteoporosis without current pathological fracture: Secondary | ICD-10-CM

## 2023-06-19 NOTE — Progress Notes (Signed)
 Location:  Other [facility].  Nursing Home Room Number: [facility] [address]A Place of Service:  SNF [phone]) Harlene An, NP  PCP: Abdul Fine, MD  Patient Care Team: Abdul Fine, MD as PCP - General [facility] Medicine)  Extended Emergency Contact Information Primary Emergency Contact: [facility] Phone: [phone] Relation: Spouse Secondary Emergency Contact: DYANA DONNY POUR Address: [address]          [place], [place] [address] Home Phone: [phone] Relation: Daughter  Goals of care: Advanced Directive information    06/19/2023    9:11 AM  Advanced Directives  Does Patient Have a Medical Advance Directive? Yes  Type of Advance Directive Out of facility DNR (pink MOST or yellow form)  Does patient want to make changes to medical advance directive? No - Patient declined     Chief Complaint  Patient presents with   Medical Management of Chronic Issues    Medical Management of Chronic Issues.     HPI:  Pt is a 89 y.o. female seen today for medical management of chronic disease.  Pt now long term at [facility] Pt with hx of dementia, frequency falls, htn, OA.  Staff reports she is eating and drinking a little better Her weight have been stable.  She has no concerns today.  No pain reported.  No constipationor diarrhea No wounds.    Past Medical History:  Diagnosis Date   Arthritis    Hypertension    Leaky heart valve    Stroke (HCC)    tia   Past Surgical History:  Procedure Laterality Date   CARDIAC CATHETERIZATION     CATARACT EXTRACTION W/PHACO Left 04/23/2016   Procedure: CATARACT EXTRACTION PHACO AND INTRAOCULAR LENS PLACEMENT (IOC);  Surgeon: Elsie Carmine, MD;  Location: [facility] ORS;  Service: Ophthalmology;  Laterality: Left;  US  59.9 AP% 20.9 CDE 12.48 FLUID PACK LOT # [id number] H   FRACTURE SURGERY     orif shoulder   JOINT REPLACEMENT     thr    Allergies  Allergen Reactions   Doxycycline Nausea And Vomiting   Tramadol      Outpatient Encounter Medications as of 06/19/2023  Medication Sig   acetaminophen  (TYLENOL ) 500 MG tablet Take 500 mg by mouth daily as needed for moderate pain or headache.   bisacodyl  (DULCOLAX) 5 MG EC tablet Take 1 tablet (5 mg total) by mouth daily as needed for moderate constipation.   Calcium Carb-Cholecalciferol (CALCIUM 600+D) 600-800 MG-UNIT TABS Take 1 tablet by mouth daily.   clopidogrel  (PLAVIX ) 75 MG tablet Take 75 mg by mouth daily.   diclofenac  Sodium (VOLTAREN ) 1 % GEL Apply 2 g topically 4 (four) times daily.   docusate sodium  (COLACE) 100 MG capsule Take 1 capsule (100 mg total) by mouth 2 (two) times daily.   donepezil  (ARICEPT ) 5 MG tablet Take 5 mg by mouth daily.   Ketotifen Fumarate (ALAWAY OP) Apply 1 drop to eye daily as needed (dry eyes).   Krill Oil 350 MG CAPS Take 1 capsule by mouth daily.   losartan (COZAAR) 25 MG tablet Take 25 mg by mouth daily.   melatonin 1 MG TABS tablet Take 1 mg by mouth at bedtime.   Multiple Vitamins-Minerals (CENTRUM SILVER PO) Take 1 tablet by mouth daily.   ondansetron (ZOFRAN) 4 MG tablet Take 4 mg by mouth every 6 (six) hours as needed for nausea or vomiting.   polyethylene glycol (MIRALAX  / GLYCOLAX ) 17 g packet Take 17 g by mouth daily as needed for  mild constipation.   pravastatin  (PRAVACHOL ) 20 MG tablet Take 20 mg by mouth at bedtime.   Zinc Oxide (TRIPLE PASTE) 12.8 % ointment Apply 1 Application topically. Every shift.   No facility-administered encounter medications on file as of 06/19/2023.    Review of Systems  Unable to perform ROS: Dementia     Immunization History  Administered Date(s) Administered   Influenza-Unspecified 04/08/2018, 02/09/2020, 04/25/2021, 05/24/2022, 04/02/2023   PFIZER Comirnaty(Gray Top)Covid-19 Tri-Sucrose Vaccine 07/01/2019, 07/22/2019, 07/24/2020   Pneumococcal Polysaccharide-23 02/04/2013   Td 06/20/2020   Unspecified SARS-COV-2 Vaccination 03/07/2023   Pertinent  Health Maintenance  Due  Topic Date Due   DEXA SCAN  Never done   INFLUENZA VACCINE  Completed       No data to display         Functional Status Survey:    Vitals:   06/19/23 0903  BP: 120/65  Pulse: 93  Resp: 16  Temp: (!) 97.4 F (36.3 C)  SpO2: 95%  Weight: 104 lb 9.6 oz (47.4 kg)  Height: 5' 2 (1.575 m)   Body mass index is 19.13 kg/m. Physical Exam Constitutional:      General: She is not in acute distress.    Appearance: She is well-developed. She is not diaphoretic.  HENT:     Head: Normocephalic and atraumatic.     Mouth/Throat:     Pharynx: No oropharyngeal exudate.  Eyes:     Conjunctiva/sclera: Conjunctivae normal.     Pupils: Pupils are equal, round, and reactive to light.  Cardiovascular:     Rate and Rhythm: Normal rate and regular rhythm.     Heart sounds: Normal heart sounds.  Pulmonary:     Effort: Pulmonary effort is normal.     Breath sounds: Normal breath sounds.  Abdominal:     General: Bowel sounds are normal.     Palpations: Abdomen is soft.  Musculoskeletal:     Cervical back: Normal range of motion and neck supple.     Right lower leg: No edema.     Left lower leg: No edema.  Skin:    General: Skin is warm and dry.  Neurological:     Mental Status: She is alert.     Motor: Weakness present.     Gait: Gait abnormal.  Psychiatric:        Mood and Affect: Mood normal.     Labs reviewed: Recent Labs    01/27/23 1822 01/28/23 0421 01/29/23 0326 04/10/23 0000  NA 139 140 136 138  K 3.9 3.9 3.9 3.9  CL 107 111 111 102  CO2 24 22 21* 28*  GLUCOSE 168* 122* 111*  --   BUN 24* 23 19 14   CREATININE 0.90 0.76 0.73 0.8  CALCIUM 9.7 8.7* 8.1* 9.0   Recent Labs    05/07/23 0000  AST 21  ALT 12  ALKPHOS 82  ALBUMIN 3.3*   Recent Labs    01/25/23 1159 01/27/23 1822 01/28/23 0421 01/29/23 0326 01/30/23 0216 01/30/23 1323 01/31/23 0424 05/07/23 0000  WBC 6.6 11.6* 9.8 8.3  --   --   --  6.0  NEUTROABS 4.8  --   --  5.8  --   --   --   3,726.00  HGB 12.2 11.7* 10.4* 9.7*   < > 10.3* 10.5* 12.2  HCT 37.1 34.4* 30.3* 27.9*   < > 30.8* 31.1* 37  MCV 96.9 94.2 92.7 92.4  --   --   --   --  PLT 172 152 140* 143*  --   --   --  278   < > = values in this interval not displayed.   No results found for: TSH No results found for: HGBA1C Lab Results  Component Value Date   CHOL 144 05/07/2023   HDL 62 05/07/2023   LDLCALC 70 05/07/2023   TRIG 50 05/07/2023    Significant Diagnostic Results in last 30 days:  No results found.  Assessment/Plan 1. Benign essential hypertension (Primary) Low bp on losartan 25 but was elevated prior Will start losartan 12.5 mg daily and have staff monitor.   2. Coronary artery disease involving native coronary artery of native heart without angina pectoris Stable, no chest pains reports  3. Frequent falls Fall precautions, no recent falls  4. Moderate dementia without behavioral disturbance, psychotic disturbance, mood disturbance, or anxiety, unspecified dementia type (HCC) -Stable, no acute changes in cognitive or functional status, continue supportive care.   5. Stage 3a chronic kidney disease (HCC) Chronic and stable Encourage proper hydration Follow metabolic panel Avoid nephrotoxic meds (NSAIDS)  6. Osteoporosis, post-menopausal Continues on calcium and vit d     Rumeal Cullipher K. Caro BODILY [facility] [phone]

## 2023-06-26 ENCOUNTER — Non-Acute Institutional Stay (INDEPENDENT_AMBULATORY_CARE_PROVIDER_SITE_OTHER): Payer: Medicare Other | Admitting: Nurse Practitioner

## 2023-06-26 ENCOUNTER — Encounter: Payer: Self-pay | Admitting: Nurse Practitioner

## 2023-06-26 DIAGNOSIS — Z0000 Encounter for general adult medical examination without abnormal findings: Secondary | ICD-10-CM | POA: Diagnosis not present

## 2023-06-26 NOTE — Patient Instructions (Signed)
  Caitlyn Burns , Thank you for taking time to come for your Medicare Wellness Visit. I appreciate your ongoing commitment to your health goals. Please review the following plan we discussed and let me know if I can assist you in the future.     This is a list of the screening recommended for you and due dates:  Health Maintenance  Topic Date Due   Zoster (Shingles) Vaccine (1 of 2) Never done   Pneumonia Vaccine (2 of 2 - PCV) 02/04/2014   Medicare Annual Wellness Visit  06/25/2024   DTaP/Tdap/Td vaccine (2 - Tdap) 06/20/2030   Flu Shot  Completed   COVID-19 Vaccine  Completed   HPV Vaccine  Aged Out   DEXA scan (bone density measurement)  Discontinued

## 2023-06-26 NOTE — Progress Notes (Signed)
Subjective:   Caitlyn Burns is a 89 y.o. female who presents for Medicare Annual (Subsequent) preventive examination.  Visit Complete: In person at [facility]    Cardiac Risk Factors include: advanced age (>51men, >22 women);hypertension;sedentary lifestyle     Objective:    Today's Vitals   06/26/23 0939  BP: 118/65  Pulse: 75  Resp: 16  Temp: 97.6 F (36.4 C)  SpO2: 99%  Weight: 105 lb 4.8 oz (47.8 kg)  Height: 5\' 2"  (1.575 m)   Body mass index is 19.26 kg/m.     06/26/2023   10:04 AM 06/19/2023    9:11 AM 04/01/2023    9:42 AM 03/10/2023   10:56 AM 02/24/2023    3:57 PM 02/12/2023   12:38 PM 01/27/2023    4:28 PM  Advanced Directives  Does Patient Have a Medical Advance Directive? Yes Yes Yes Yes Yes Yes Yes  Type of Advance Directive Out of facility DNR (pink MOST or yellow form) Out of facility DNR (pink MOST or yellow form) Out of facility DNR (pink MOST or yellow form) Out of facility DNR (pink MOST or yellow form) Out of facility DNR (pink MOST or yellow form) Out of facility DNR (pink MOST or yellow form)   Does patient want to make changes to medical advance directive? No - Patient declined No - Patient declined No - Patient declined No - Patient declined No - Patient declined No - Patient declined     Current Medications (verified) Outpatient Encounter Medications as of 06/26/2023  Medication Sig   acetaminophen (TYLENOL) 500 MG tablet Take 500 mg by mouth daily as needed for moderate pain or headache.   bisacodyl (DULCOLAX) 5 MG EC tablet Take 1 tablet (5 mg total) by mouth daily as needed for moderate constipation.   Calcium Carb-Cholecalciferol (CALCIUM 600+D) 600-800 MG-UNIT TABS Take 1 tablet by mouth daily.   clopidogrel (PLAVIX) 75 MG tablet Take 75 mg by mouth daily.   diclofenac Sodium (VOLTAREN) 1 % GEL Apply 2 g topically 4 (four) times daily.   docusate sodium (COLACE) 100 MG capsule Take 1 capsule (100 mg total) by mouth 2 (two) times daily.    donepezil (ARICEPT) 5 MG tablet Take 5 mg by mouth daily.   Ketotifen Fumarate (ALAWAY OP) Apply 1 drop to eye daily as needed (dry eyes).   Krill Oil 350 MG CAPS Take 1 capsule by mouth daily.   losartan (COZAAR) 25 MG tablet Take 12.5 mg by mouth daily.   melatonin 1 MG TABS tablet Take 1 mg by mouth at bedtime.   Multiple Vitamins-Minerals (CENTRUM SILVER PO) Take 1 tablet by mouth daily.   ondansetron (ZOFRAN) 4 MG tablet Take 4 mg by mouth every 6 (six) hours as needed for nausea or vomiting.   polyethylene glycol (MIRALAX / GLYCOLAX) 17 g packet Take 17 g by mouth daily as needed for mild constipation.   pravastatin (PRAVACHOL) 20 MG tablet Take 20 mg by mouth at bedtime.   Zinc Oxide (TRIPLE PASTE) 12.8 % ointment Apply 1 Application topically. Every shift.   No facility-administered encounter medications on file as of 06/26/2023.    Allergies (verified) Doxycycline and Tramadol   History: Past Medical History:  Diagnosis Date   Arthritis    Hypertension    Leaky heart valve    Stroke (HCC)    tia   Past Surgical History:  Procedure Laterality Date   CARDIAC CATHETERIZATION     CATARACT EXTRACTION W/PHACO Left 04/23/2016  Procedure: CATARACT EXTRACTION PHACO AND INTRAOCULAR LENS PLACEMENT (IOC);  Surgeon: Galen Manila, MD;  Location: [facility] ORS;  Service: Ophthalmology;  Laterality: Left;  [place] 59.9 AP% 20.9 CDE 12.48 FLUID PACK LOT # [id number] H   FRACTURE SURGERY     orif shoulder   JOINT REPLACEMENT     thr   History reviewed. No pertinent family history. Social History   Socioeconomic History   Marital status: Married    Spouse name: Not on file   Number of children: Not on file   Years of education: Not on file   Highest education level: Not on file  Occupational History   Not on file  Tobacco Use   Smoking status: Never   Smokeless tobacco: Never  Substance and Sexual Activity   Alcohol use: No   Drug use: Never   Sexual activity: Not Currently   Other Topics Concern   Not on file  Social History Narrative   Not on file   Social Drivers of Health   Financial Resource Strain: Not on file  Food Insecurity: No Food Insecurity (01/27/2023)   Hunger Vital Sign    Worried About Running Out of Food in the Last Year: Never true    Ran Out of Food in the Last Year: Never true  Transportation Needs: No Transportation Needs (01/27/2023)   PRAPARE - Administrator, Civil Service (Medical): No    Lack of Transportation (Non-Medical): No  Physical Activity: Not on file  Stress: Not on file  Social Connections: Not on file    Tobacco Counseling Counseling given: Not Answered   Clinical Intake:  Pre-visit preparation completed: Yes  Pain : No/denies pain     BMI - recorded: 19 Nutritional Status: BMI <19  Underweight Nutritional Risks: Other (Comment) (dementia)  How often do you need to have someone help you when you read instructions, pamphlets, or other written materials from your doctor or pharmacy?: 4 - Often         Activities of Daily Living    06/26/2023    9:12 AM 01/27/2023    9:00 PM  In your present state of health, do you have any difficulty performing the following activities:  Hearing? 1 0  Vision? 1 1  Difficulty concentrating or making decisions? 1 1  Walking or climbing stairs? 1 1  Dressing or bathing? 1 1  Doing errands, shopping? 1 1  Preparing Food and eating ? Y   Using the Toilet? Y   Comment uses stand by assist and bar   In the past six months, have you accidently leaked urine? Y   Do you have problems with loss of bowel control? N   Managing your Medications? Y   Managing your Finances? Y   Housekeeping or managing your Housekeeping? Y     Patient Care Team: Earnestine Mealing, MD as PCP - General (Family Medicine)  Indicate any recent Medical Services you may have received from other than [facility] providers in the past year (date may be approximate).     Assessment:    This is a routine wellness examination for Caitlyn Burns.  Hearing/Vision screen No results found.   Goals Addressed   None    Depression Screen     No data to display          Fall Risk     No data to display          MEDICARE RISK AT HOME: Medicare Risk at Home Any stairs  in or around the home?: No Home free of loose throw rugs in walkways, pet beds, electrical cords, etc?: Yes Adequate lighting in your home to reduce risk of falls?: Yes Life alert?: No Use of a cane, walker or w/c?: Yes Grab bars in the bathroom?: Yes Shower chair or bench in shower?: Yes Elevated toilet seat or a handicapped toilet?: Yes  TIMED UP AND GO:  Was the test performed?  No    Cognitive Function:        Immunizations Immunization History  Administered Date(s) Administered   Influenza-Unspecified 04/08/2018, 02/09/2020, 04/25/2021, 05/24/2022, 04/02/2023   PFIZER Comirnaty(Gray Top)Covid-19 Tri-Sucrose Vaccine 07/01/2019, 07/22/2019, 07/24/2020   Pneumococcal Polysaccharide-23 02/04/2013   Td 06/20/2020   Unspecified SARS-COV-2 Vaccination 03/07/2023    TDAP status: Up to date  Flu Vaccine status: Up to date  Pneumococcal vaccine status: Due, Education has been provided regarding the importance of this vaccine. Advised may receive this vaccine at local pharmacy or Health Dept. Aware to provide a copy of the vaccination record if obtained from local pharmacy or Health Dept. Verbalized acceptance and understanding.  Covid-19 vaccine status: Information provided on how to obtain vaccines.   Qualifies for Shingles Vaccine? Yes   Zostavax completed No   Shingrix Completed?: No.    Education has been provided regarding the importance of this vaccine. Patient has been advised to call insurance company to determine out of pocket expense if they have not yet received this vaccine. Advised may also receive vaccine at local pharmacy or Health Dept. Verbalized acceptance and  understanding.  Screening Tests Health Maintenance  Topic Date Due   Pneumonia Vaccine 3+ Years old (2 of 2 - PCV) 02/04/2014   Zoster Vaccines- Shingrix (1 of 2) 06/25/2024 (Originally 09/19/1980)   Medicare Annual Wellness (AWV)  06/25/2024   DTaP/Tdap/Td (2 - Tdap) 06/20/2030   INFLUENZA VACCINE  Completed   COVID-19 Vaccine  Completed   HPV VACCINES  Aged Out   DEXA SCAN  Discontinued    Health Maintenance  Health Maintenance Due  Topic Date Due   Pneumonia Vaccine 32+ Years old (2 of 2 - PCV) 02/04/2014    Colorectal cancer screening: No longer required.   Mammogram status: No longer required due to age.   Lung Cancer Screening: (Low Dose CT Chest recommended if Age 3-80 years, 20 pack-year currently smoking OR have quit w/in 15years.) does not qualify.   Lung Cancer Screening Referral: na  Additional Screening:  Hepatitis C Screening: does not qualify  Vision Screening: Recommended annual ophthalmology exams for early detection of glaucoma and other disorders of the eye. Is the patient up to date with their annual eye exam?  Yes  Who is the provider or what is the name of the office in which the patient attends annual eye exams? Dr nice If pt is not established with a provider, would they like to be referred to a provider to establish care? No .   Dental Screening: Recommended annual dental exams for proper oral hygiene   Community Resource Referral / Chronic Care Management: CRR required this visit?  No   CCM required this visit?  No     Plan:     I have personally reviewed and noted the following in the patient's chart:   Medical and social history Use of alcohol, tobacco or illicit drugs  Current medications and supplements including opioid prescriptions. Patient is not currently taking opioid prescriptions. Functional ability and status Nutritional status Physical activity Advanced directives List  of other physicians Hospitalizations,  surgeries, and ER visits in previous 12 months Vitals Screenings to include cognitive, depression, and falls Referrals and appointments  In addition, I have reviewed and discussed with patient certain preventive protocols, quality metrics, and best practice recommendations. A written personalized care plan for preventive services as well as general preventive health recommendations were provided to patient.     Sharon Seller, NP   06/26/2023

## 2023-07-10 ENCOUNTER — Encounter: Payer: Self-pay | Admitting: Nurse Practitioner

## 2023-07-10 NOTE — Progress Notes (Signed)
This encounter was created in error - please disregard.

## 2023-08-29 ENCOUNTER — Non-Acute Institutional Stay: Payer: Self-pay | Admitting: Student

## 2023-08-29 ENCOUNTER — Encounter: Payer: Self-pay | Admitting: Student

## 2023-08-29 DIAGNOSIS — I2510 Atherosclerotic heart disease of native coronary artery without angina pectoris: Secondary | ICD-10-CM

## 2023-08-29 DIAGNOSIS — I10 Essential (primary) hypertension: Secondary | ICD-10-CM | POA: Diagnosis not present

## 2023-08-29 DIAGNOSIS — F03B0 Unspecified dementia, moderate, without behavioral disturbance, psychotic disturbance, mood disturbance, and anxiety: Secondary | ICD-10-CM | POA: Diagnosis not present

## 2023-08-29 DIAGNOSIS — H109 Unspecified conjunctivitis: Secondary | ICD-10-CM | POA: Diagnosis not present

## 2023-08-29 NOTE — Progress Notes (Signed)
 Location:  Other [facility] [place].  Nursing Home Room Number: [facility] [address] Place of Service:  SNF [phone]) Provider:  Earnestine Mealing, MD  Patient Care Team: Earnestine Mealing, MD as PCP - General (Family Medicine)  Extended Emergency Contact Information Primary Emergency Contact: [facility] Phone: [phone] Relation: Spouse Secondary Emergency Contact: Arrie Aran Address: [address]          [place], [place] [address] Home Phone: [phone] Relation: Daughter  Code Status:  DNR Goals of care: Advanced Directive information    08/29/2023    1:13 PM  Advanced Directives  Does Patient Have a Medical Advance Directive? Yes  Type of Advance Directive Out of facility DNR (pink MOST or yellow form)  Does patient want to make changes to medical advance directive? No - Patient declined     Chief Complaint  Patient presents with   Medical Management of Chronic Issues    Medical Management of Chronic Issues.     HPI:  Pt is a 89 y.o. female seen today for medical management of chronic diseases.    Patient appears well. She says nonsensical phrases, but is in good spirits.   Nursing with concern for swelling in eye and redness. New in the last couple of days. Patient is now 100 lbs which is the lowest she has been. She has family who come to visit often for meals and encourage PO intake.   Past Medical History:  Diagnosis Date   Arthritis    Hypertension    Leaky heart valve    Stroke (HCC)    tia   Past Surgical History:  Procedure Laterality Date   CARDIAC CATHETERIZATION     CATARACT EXTRACTION W/PHACO Left 04/23/2016   Procedure: CATARACT EXTRACTION PHACO AND INTRAOCULAR LENS PLACEMENT (IOC);  Surgeon: Galen Manila, MD;  Location: [facility] ORS;  Service: Ophthalmology;  Laterality: Left;  [place] 59.9 AP% 20.9 CDE 12.48 FLUID PACK LOT # [id number]   FRACTURE SURGERY     orif shoulder   JOINT REPLACEMENT     thr    Allergies  Allergen Reactions    Doxycycline Nausea And Vomiting   Tramadol     Outpatient Encounter Medications as of 08/29/2023  Medication Sig   acetaminophen (TYLENOL) 500 MG tablet Take 500 mg by mouth daily as needed for moderate pain or headache.   bisacodyl (DULCOLAX) 5 MG EC tablet Take 1 tablet (5 mg total) by mouth daily as needed for moderate constipation.   Calcium Carb-Cholecalciferol (CALCIUM 600+D) 600-800 MG-UNIT TABS Take 1 tablet by mouth daily.   clopidogrel (PLAVIX) 75 MG tablet Take 75 mg by mouth daily.   diclofenac Sodium (VOLTAREN) 1 % GEL Apply 2 g topically 4 (four) times daily.   donepezil (ARICEPT) 5 MG tablet Take 5 mg by mouth daily.   Ketotifen Fumarate (ALAWAY OP) Apply 1 drop to eye daily as needed (dry eyes).   melatonin 1 MG TABS tablet Take 1 mg by mouth at bedtime.   Multiple Vitamins-Minerals (CENTRUM SILVER PO) Take 1 tablet by mouth daily.   polyethylene glycol (MIRALAX / GLYCOLAX) 17 g packet Take 17 g by mouth daily as needed for mild constipation.   pravastatin (PRAVACHOL) 20 MG tablet Take 20 mg by mouth at bedtime.   sennosides-docusate sodium (SENOKOT-S) 8.6-50 MG tablet Take 1 tablet by mouth 2 (two) times daily.   Zinc Oxide (TRIPLE PASTE) 12.8 % ointment Apply 1 Application topically. Every shift.   [DISCONTINUED] losartan (COZAAR) 25 MG tablet Take 12.5  mg by mouth daily.   [DISCONTINUED] docusate sodium (COLACE) 100 MG capsule Take 1 capsule (100 mg total) by mouth 2 (two) times daily. (Patient not taking: Reported on 08/29/2023)   [DISCONTINUED] Krill Oil 350 MG CAPS Take 1 capsule by mouth daily. (Patient not taking: Reported on 07/10/2023)   [DISCONTINUED] ondansetron (ZOFRAN) 4 MG tablet Take 4 mg by mouth every 6 (six) hours as needed for nausea or vomiting. (Patient not taking: Reported on 08/29/2023)   No facility-administered encounter medications on file as of 08/29/2023.    Review of Systems  Immunization History  Administered Date(s) Administered    Influenza-Unspecified 04/08/2018, 02/09/2020, 04/25/2021, 05/24/2022, 04/02/2023   PFIZER Comirnaty(Gray Top)Covid-19 Tri-Sucrose Vaccine 07/01/2019, 07/22/2019, 07/24/2020   PNEUMOCOCCAL CONJUGATE-20 06/27/2023   Pneumococcal Polysaccharide-23 02/04/2013   Td 06/20/2020   Unspecified SARS-COV-2 Vaccination 03/07/2023   Pertinent  Health Maintenance Due  Topic Date Due   INFLUENZA VACCINE  Completed   DEXA SCAN  Discontinued       No data to display         Functional Status Survey:    Vitals:   08/29/23 1308  BP: (!) 96/54  Pulse: 82  Resp: 16  Temp: (!) 97.4 F (36.3 C)  SpO2: 97%  Weight: 100 lb 6.4 oz (45.5 kg)  Height: 5\' 2"  (1.575 m)   Body mass index is 18.36 kg/m. Physical Exam Constitutional:      Comments: Thin, frail  Eyes:     General:        Right eye: Discharge present.        Left eye: Discharge present.    Comments: Eye swelling  Pulmonary:     Effort: Pulmonary effort is normal.  Neurological:     Mental Status: She is alert. Mental status is at baseline. She is disoriented.     Labs reviewed: Recent Labs    01/27/23 1822 01/28/23 0421 01/29/23 0326 04/10/23 0000  NA 139 140 136 138  K 3.9 3.9 3.9 3.9  CL 107 111 111 102  CO2 24 22 21* 28*  GLUCOSE 168* 122* 111*  --   BUN 24* 23 19 14   CREATININE 0.90 0.76 0.73 0.8  CALCIUM 9.7 8.7* 8.1* 9.0   Recent Labs    05/07/23 0000  AST 21  ALT 12  ALKPHOS 82  ALBUMIN 3.3*   Recent Labs    01/25/23 1159 01/27/23 1822 01/28/23 0421 01/29/23 0326 01/30/23 0216 01/30/23 1323 01/31/23 0424 05/07/23 0000  WBC 6.6 11.6* 9.8 8.3  --   --   --  6.0  NEUTROABS 4.8  --   --  5.8  --   --   --  3,726.00  HGB 12.2 11.7* 10.4* 9.7*   < > 10.3* 10.5* 12.2  HCT 37.1 34.4* 30.3* 27.9*   < > 30.8* 31.1* 37  MCV 96.9 94.2 92.7 92.4  --   --   --   --   PLT 172 152 140* 143*  --   --   --  278   < > = values in this interval not displayed.   No results found for: "TSH" No results found  for: "HGBA1C" Lab Results  Component Value Date   CHOL 144 05/07/2023   HDL 62 05/07/2023   LDLCALC 70 05/07/2023   TRIG 50 05/07/2023    Significant Diagnostic Results in last 30 days:  No results found.  Assessment/Plan Benign essential hypertension  Coronary artery disease involving native coronary artery of  native heart without angina pectoris  Moderate dementia without behavioral disturbance, psychotic disturbance, mood disturbance, or anxiety, unspecified dementia type (HCC)  Conjunctivitis, unspecified conjunctivitis type, unspecified laterality Patient with history of dementia with continued decline. She has had weight loss. She is incontinent of stool and urine. Her FAST stage is 7A. She is unstable and unsteady on her feet and attempts to stand often requiring frequent reorientation. Requires 24/7 support and surveillance. Given low blood pressures, will discontinue losartan at this time. Continue plavix given hx of strokes, however, will continue GOC conversations with family regarding risk-benefit analysis. For conjunctivitis cold compresses and zyrtec.    Family/ staff Communication: nursing  Labs/tests ordered:  CMP with next lab collection

## 2023-08-31 ENCOUNTER — Encounter: Payer: Self-pay | Admitting: Student

## 2023-09-04 LAB — HEPATIC FUNCTION PANEL
ALT: 12 U/L (ref 7–35)
AST: 22 (ref 13–35)
Alkaline Phosphatase: 86 (ref 25–125)
Bilirubin, Total: 0.5

## 2023-09-04 LAB — BASIC METABOLIC PANEL WITH GFR
BUN: 17 (ref 4–21)
CO2: 26 — AB (ref 13–22)
Chloride: 106 (ref 99–108)
Creatinine: 0.7 (ref 0.5–1.1)
Glucose: 73
Potassium: 4.3 meq/L (ref 3.5–5.1)
Sodium: 139 (ref 137–147)

## 2023-09-04 LAB — COMPREHENSIVE METABOLIC PANEL WITH GFR
Albumin: 3.6 (ref 3.5–5.0)
Calcium: 8.8 (ref 8.7–10.7)
Globulin: 2.6
eGFR: 84

## 2023-10-28 ENCOUNTER — Non-Acute Institutional Stay (SKILLED_NURSING_FACILITY): Admitting: Nurse Practitioner

## 2023-10-28 ENCOUNTER — Encounter: Payer: Self-pay | Admitting: Nurse Practitioner

## 2023-10-28 DIAGNOSIS — M150 Primary generalized (osteo)arthritis: Secondary | ICD-10-CM | POA: Insufficient documentation

## 2023-10-28 DIAGNOSIS — I2510 Atherosclerotic heart disease of native coronary artery without angina pectoris: Secondary | ICD-10-CM | POA: Diagnosis not present

## 2023-10-28 DIAGNOSIS — N1831 Chronic kidney disease, stage 3a: Secondary | ICD-10-CM | POA: Diagnosis not present

## 2023-10-28 DIAGNOSIS — I10 Essential (primary) hypertension: Secondary | ICD-10-CM | POA: Diagnosis not present

## 2023-10-28 DIAGNOSIS — F03B0 Unspecified dementia, moderate, without behavioral disturbance, psychotic disturbance, mood disturbance, and anxiety: Secondary | ICD-10-CM

## 2023-10-28 DIAGNOSIS — J302 Other seasonal allergic rhinitis: Secondary | ICD-10-CM | POA: Insufficient documentation

## 2023-10-28 DIAGNOSIS — E785 Hyperlipidemia, unspecified: Secondary | ICD-10-CM

## 2023-10-28 NOTE — Assessment & Plan Note (Addendum)
 Blood pressure well controlled, goal bp <140/90 Losartan was stopped due to low bp, will continue to follow  follow metabolic panel

## 2023-10-28 NOTE — Assessment & Plan Note (Signed)
 Stable, no acute changes in cognitive or functional status, continue supportive care.

## 2023-10-28 NOTE — Assessment & Plan Note (Signed)
 Continues on pravachol . LDL at goal on last labs

## 2023-10-28 NOTE — Progress Notes (Signed)
 Location:  Other [facility] [place].  Nursing Home Room Number: [facility] [address] Place of Service:  SNF [phone]) [facility], NP  PCP: Valrie Gehrig, MD  Patient Care Team: Valrie Gehrig, MD as PCP - General [facility])  Extended Emergency Contact Information Primary Emergency Contact: [facility] Phone: [phone] Relation: Spouse Secondary Emergency Contact: Belvin Boys Address: [address]          [place], [place] [address] Home Phone: [phone] Relation: Daughter  Goals of care: Advanced Directive information    08/29/2023    1:13 PM  Advanced Directives  Does Patient Have a Medical Advance Directive? Yes  Type of Advance Directive Out of facility DNR (pink MOST or yellow form)  Does patient want to make changes to medical advance directive? No - Patient declined     Chief Complaint  Patient presents with   Medical Management of Chronic Issues    Medical Management of Chronic Issues.     HPI:  Pt is a 89 y.o. female seen today for medical management of chronic disease.  Pt with hx of htn, dementia, CVA.  She is doing well at [facility] for long term care.  She did have a recent fall but no injury She is eating well and weights have been stable- continues on nutritional supplement and followed by RD.  Nursing has no concerns.    Past Medical History:  Diagnosis Date   Arthritis    Hypertension    Leaky heart valve    Stroke (HCC)    tia   Past Surgical History:  Procedure Laterality Date   CARDIAC CATHETERIZATION     CATARACT EXTRACTION W/PHACO Left 04/23/2016   Procedure: CATARACT EXTRACTION PHACO AND INTRAOCULAR LENS PLACEMENT (IOC);  Surgeon: Clair Crews, MD;  Location: [facility] ORS;  Service: Ophthalmology;  Laterality: Left;  US  59.9 AP% 20.9 CDE 12.48 FLUID PACK LOT # [id number] H   FRACTURE SURGERY     orif shoulder   JOINT REPLACEMENT     thr    Allergies  Allergen Reactions   Doxycycline Nausea And Vomiting   Tramadol      Outpatient Encounter Medications as of 10/28/2023  Medication Sig   acetaminophen  (TYLENOL ) 500 MG tablet Take 500 mg by mouth daily as needed for moderate pain or headache.   bisacodyl  (DULCOLAX) 5 MG EC tablet Take 1 tablet (5 mg total) by mouth daily as needed for moderate constipation.   cetirizine (ZYRTEC) 5 MG tablet Take 5 mg by mouth daily.   clopidogrel  (PLAVIX ) 75 MG tablet Take 75 mg by mouth daily.   diclofenac  Sodium (VOLTAREN ) 1 % GEL Apply 2 g topically 4 (four) times daily.   donepezil  (ARICEPT ) 5 MG tablet Take 5 mg by mouth daily.   Ketotifen Fumarate (ALAWAY OP) Apply 1 drop to eye daily as needed (dry eyes).   melatonin 1 MG TABS tablet Take 1 mg by mouth at bedtime.   polyethylene glycol (MIRALAX  / GLYCOLAX ) 17 g packet Take 17 g by mouth daily as needed for mild constipation.   pravastatin  (PRAVACHOL ) 20 MG tablet Take 20 mg by mouth at bedtime.   Zinc Oxide (TRIPLE PASTE) 12.8 % ointment Apply 1 Application topically. Every shift.   [DISCONTINUED] Calcium Carb-Cholecalciferol (CALCIUM 600+D) 600-800 MG-UNIT TABS Take 1 tablet by mouth daily. (Patient not taking: Reported on 10/28/2023)   [DISCONTINUED] Multiple Vitamins-Minerals (CENTRUM SILVER PO) Take 1 tablet by mouth daily. (Patient not taking: Reported on 10/28/2023)   [DISCONTINUED] sennosides-docusate sodium  (SENOKOT-S) 8.6-50  MG tablet Take 1 tablet by mouth 2 (two) times daily. (Patient not taking: Reported on 10/28/2023)   No facility-administered encounter medications on file as of 10/28/2023.    Review of Systems  Unable to perform ROS: Dementia     Immunization History  Administered Date(s) Administered   Influenza-Unspecified 04/08/2018, 02/09/2020, 04/25/2021, 05/24/2022, 04/02/2023   PFIZER Comirnaty(Gray Top)Covid-19 Tri-Sucrose Vaccine 07/01/2019, 07/22/2019, 07/24/2020   PNEUMOCOCCAL CONJUGATE-20 06/27/2023   Pneumococcal Polysaccharide-23 02/04/2013   Td 06/20/2020   Unspecified SARS-COV-2  Vaccination 03/07/2023   Pertinent  Health Maintenance Due  Topic Date Due   INFLUENZA VACCINE  01/09/2024   DEXA SCAN  Discontinued       No data to display         Functional Status Survey:    Vitals:   10/28/23 0944  BP: 128/73  Pulse: 78  Resp: 17  Temp: 97.7 F (36.5 C)  SpO2: 95%  Weight: 106 lb 12.8 oz (48.4 kg)  Height: 5\' 2"  (1.575 m)   Body mass index is 19.53 kg/m. Physical Exam Constitutional:      General: She is not in acute distress.    Appearance: She is well-developed. She is not diaphoretic.  HENT:     Head: Normocephalic and atraumatic.     Mouth/Throat:     Pharynx: No oropharyngeal exudate.  Eyes:     Conjunctiva/sclera: Conjunctivae normal.     Pupils: Pupils are equal, round, and reactive to light.  Cardiovascular:     Rate and Rhythm: Normal rate and regular rhythm.     Heart sounds: Normal heart sounds.  Pulmonary:     Effort: Pulmonary effort is normal.     Breath sounds: Normal breath sounds.  Abdominal:     General: Bowel sounds are normal.     Palpations: Abdomen is soft.  Musculoskeletal:     Cervical back: Normal range of motion and neck supple.     Right lower leg: No edema.     Left lower leg: No edema.  Skin:    General: Skin is warm and dry.  Neurological:     Mental Status: She is alert.     Motor: Weakness present.     Gait: Gait abnormal.  Psychiatric:        Mood and Affect: Mood normal.     Labs reviewed: Recent Labs    01/27/23 1822 01/28/23 0421 01/29/23 0326 04/10/23 0000 09/04/23 0000  NA 139 140 136 138 139  K 3.9 3.9 3.9 3.9 4.3  CL 107 111 111 102 106  CO2 24 22 21* 28* 26*  GLUCOSE 168* 122* 111*  --   --   BUN 24* 23 19 14 17   CREATININE 0.90 0.76 0.73 0.8 0.7  CALCIUM 9.7 8.7* 8.1* 9.0 8.8   Recent Labs    05/07/23 0000 09/04/23 0000  AST 21 22  ALT 12 12  ALKPHOS 82 86  ALBUMIN 3.3* 3.6   Recent Labs    01/25/23 1159 01/27/23 1822 01/28/23 0421 01/29/23 0326 01/30/23 0216  01/30/23 1323 01/31/23 0424 05/07/23 0000  WBC 6.6 11.6* 9.8 8.3  --   --   --  6.0  NEUTROABS 4.8  --   --  5.8  --   --   --  3,726.00  HGB 12.2 11.7* 10.4* 9.7*   < > 10.3* 10.5* 12.2  HCT 37.1 34.4* 30.3* 27.9*   < > 30.8* 31.1* 37  MCV 96.9 94.2 92.7 92.4  --   --   --   --  PLT 172 152 140* 143*  --   --   --  278   < > = values in this interval not displayed.   No results found for: "TSH" No results found for: "HGBA1C" Lab Results  Component Value Date   CHOL 144 05/07/2023   HDL 62 05/07/2023   LDLCALC 70 05/07/2023   TRIG 50 05/07/2023    Significant Diagnostic Results in last 30 days:  No results found.  Assessment/Plan Benign essential hypertension Blood pressure well controlled, goal bp <140/90 Losartan was stopped due to low bp, will continue to follow  follow metabolic panel   CAD (coronary artery disease), native coronary artery Stable as this time, no chest pains.   Hyperlipidemia Continues on pravachol . LDL at goal on last labs  Moderate dementia without behavioral disturbance, psychotic disturbance, mood disturbance, or anxiety (HCC) Stable, no acute changes in cognitive or functional status, continue supportive care.   Primary osteoarthritis involving multiple joints Stable without pain, continues on voltaren  gel and tylenol    Seasonal allergies Controlled on zyrtec 5 mg daily  Stage 3a chronic kidney disease (HCC) Chronic and stable Encourage proper hydration Follow metabolic panel Avoid nephrotoxic meds (NSAIDS)     Caitlyn Burns K. Denney Fisherman [facility] [phone]

## 2023-10-28 NOTE — Assessment & Plan Note (Signed)
 Chronic and stable Encourage proper hydration Follow metabolic panel Avoid nephrotoxic meds (NSAIDS)

## 2023-10-28 NOTE — Assessment & Plan Note (Signed)
 Controlled on zyrtec 5 mg daily

## 2023-10-28 NOTE — Assessment & Plan Note (Signed)
 Stable without pain, continues on voltaren  gel and tylenol 

## 2023-10-28 NOTE — Assessment & Plan Note (Signed)
 Stable as this time, no chest pains.

## 2023-11-10 ENCOUNTER — Emergency Department
Admission: EM | Admit: 2023-11-10 | Discharge: 2023-11-11 | Disposition: A | Discharge status: Home / Self Care | Attending: Emergency Medicine | Admitting: Emergency Medicine

## 2023-11-10 ENCOUNTER — Emergency Department

## 2023-11-10 ENCOUNTER — Other Ambulatory Visit: Payer: Self-pay

## 2023-11-10 DIAGNOSIS — S42202A Unspecified fracture of upper end of left humerus, initial encounter for closed fracture: Secondary | ICD-10-CM

## 2023-11-10 DIAGNOSIS — W19XXXA Unspecified fall, initial encounter: Secondary | ICD-10-CM

## 2023-11-10 DIAGNOSIS — S40912A Unspecified superficial injury of left shoulder, initial encounter: Secondary | ICD-10-CM | POA: Diagnosis present

## 2023-11-10 DIAGNOSIS — F0390 Unspecified dementia without behavioral disturbance: Secondary | ICD-10-CM | POA: Diagnosis not present

## 2023-11-10 NOTE — ED Triage Notes (Signed)
 Patient arrives by [facility] from [facility].  Patient fell around 1600 and received a portable x-ray.  Results showed a left humeral fracture at the head with no displacement.  [facility] staff wanted her sent out for evaluation.  EMS reports family has been notified.

## 2023-11-10 NOTE — ED Provider Notes (Signed)
 [facility] Provider Note    Event Date/Time   First MD Initiated Contact with Patient 11/10/23 2310     (approximate)   History   Fall  Level 5 caveat:  history/ROS limited by chronic dementia  HPI Caitlyn Burns is a 89 y.o. female with a known history of dementia who presents from [facility] facility by EMS for evaluation of a fracture to the left humerus after a fall.  Reportedly she had a fall earlier today and x-rays were obtained at the facility.  X-rays were obtained of the shoulder, elbow, forearm, and wrist, but the fracture was apparently to the proximal humerus.  Once the fracture was discovered she was sent to the ED for further evaluation.  The patient is not speaking with us  at this time but it is unknown what is her baseline.  She would not answer my questions or open her eyes but she was clearly forcibly keeping her eyes shut when I tried to examine her.  She then spoke to one of the techs voluntarily but I could not make out what she said.     Physical Exam   Triage Vital Signs: ED Triage Vitals  Encounter Vitals Group     BP 11/10/23 2314 (!) 144/65     Systolic BP Percentile --      Diastolic BP Percentile --      Pulse Rate 11/10/23 2314 88     Resp 11/10/23 2314 14     Temp 11/10/23 2314 98.2 F (36.8 C)     Temp Source 11/10/23 2314 Oral     SpO2 11/10/23 2314 97 %     Weight --      Height --      Head Circumference --      Peak Flow --      Pain Score 11/10/23 2315 Asleep     Pain Loc --      Pain Education --      Exclude from Growth Chart --     Most recent vital signs: Vitals:   11/10/23 2314  BP: (!) 144/65  Pulse: 88  Resp: 14  Temp: 98.2 F (36.8 C)  SpO2: 97%    General: Awake but not willing or able to communicate with us .  No obvious sign of trauma with no evidence of hematoma nor ecchymosis on her head or neck.   CV:  Good peripheral perfusion.  Regular rate and rhythm. Resp:  Normal effort. no  accessory muscle usage nor intercostal retractions.  Lungs are clear to auscultation. Abd:  No distention.  No tenderness to palpation of her abdomen. Other:  Patient is keeping her left arm adducted and flexed at 90 degrees at the elbow.  When I try to extend and abduct her arm, she resists and winces in pain.  She also acknowledges pain if I try to palpate the proximal humerus.  There is likely a small amount of swelling but no gross deformity visible.   ED Results / Procedures / Treatments   Labs (all labs ordered are listed, but only abnormal results are displayed) Labs Reviewed - No data to display    RADIOLOGY See ED course for details   PROCEDURES:  Critical Care performed: No  Procedures    IMPRESSION / MDM / ASSESSMENT AND PLAN / ED COURSE  I reviewed the triage vital signs and the nursing notes.  Differential diagnosis includes, but is not limited to, fracture, dislocation, head injury, C-spine injury.  Patient's presentation is most consistent with acute presentation with potential threat to life or bodily function.  Labs/studies ordered: Left shoulder x-rays, left humerus x-rays.  Interventions/Medications given:  Medications - No data to display  (Note:  hospital course my include additional interventions and/or labs/studies not listed above.)   Patient arrives with DNR paperwork.  Reportedly the daughter is on her way.  Before evaluating more broadly, I will speak with the daughter about goals of care.  I reviewed and interpreted the paperwork sent from [facility] which confirmed a proximal humerus fracture, but since I cannot see the images and we likely will need them in our system, I am repeating the shoulder and humerus x-rays.  I see no visible sign of trauma on physical exam.  I will hold off on a broader workup at this time.  Of note, EMS reports that the patient was sleeping soundly in bed when they arrived and pulled her  out of bed to bring her to the emergency department.   Clinical Course as of 11/11/23 0102  Tue Nov 11, 2023  0054 I viewed and interpreted the patient's humerus and shoulder x-rays and she has an obvious fracture of the proximal humerus.  Radiology confirms impacted and fragmented fracture.  Patient is sleeping comfortably at this time.  His son is now at bedside.  We discussed her case in detail.  I explained that there is no other evidence of injury.  He confirmed that [facility] told him that she had a mechanical fall that was witnessed.  He does not want to proceed with any additional evaluation and I think that is very reasonable given no indication of an emergent condition.  He confirmed her DNR status.  I will provide a prescription for tramadol to help with pain management and provide the name and number for orthopedics as well as putting her and a left shoulder sling/immobilizer, but there is no other intervention recommended at this time.  No indication to emergently consult orthopedics.  Son understands and agrees and thinks that he will be able to transport the patient back to her facility. [CF]  [id number] Of note, patient has a nonspecific allergy to tramadol listed.  I will instead prescribe Norco. [CF]    Clinical Course User Index [CF] Lynnda Sas, MD     FINAL CLINICAL IMPRESSION(S) / ED DIAGNOSES   Final diagnoses:  Fall, initial encounter  Closed fracture of proximal end of left humerus, unspecified fracture morphology, initial encounter     Rx / DC Orders   ED Discharge Orders          Ordered    HYDROcodone -acetaminophen  (NORCO/VICODIN) 5-325 MG tablet  Every 6 hours PRN        11/11/23 0100             Note:  This document was prepared using Dragon voice recognition software and may include unintentional dictation errors.   Lynnda Sas, MD 11/11/23 [phone]

## 2023-11-11 ENCOUNTER — Encounter: Payer: Self-pay | Admitting: Adult Health

## 2023-11-11 ENCOUNTER — Encounter: Payer: Self-pay | Admitting: Emergency Medicine

## 2023-11-11 ENCOUNTER — Non-Acute Institutional Stay (SKILLED_NURSING_FACILITY): Payer: Self-pay | Admitting: Adult Health

## 2023-11-11 DIAGNOSIS — S42202D Unspecified fracture of upper end of left humerus, subsequent encounter for fracture with routine healing: Secondary | ICD-10-CM

## 2023-11-11 DIAGNOSIS — S42202S Unspecified fracture of upper end of left humerus, sequela: Secondary | ICD-10-CM

## 2023-11-11 DIAGNOSIS — I2510 Atherosclerotic heart disease of native coronary artery without angina pectoris: Secondary | ICD-10-CM

## 2023-11-11 DIAGNOSIS — E785 Hyperlipidemia, unspecified: Secondary | ICD-10-CM | POA: Diagnosis not present

## 2023-11-11 DIAGNOSIS — F03C0 Unspecified dementia, severe, without behavioral disturbance, psychotic disturbance, mood disturbance, and anxiety: Secondary | ICD-10-CM | POA: Diagnosis not present

## 2023-11-11 MED ORDER — HYDROCODONE-ACETAMINOPHEN 5-325 MG PO TABS: 1.0000 | ORAL_TABLET | Freq: Four times a day (QID) | ORAL | 0 refills | Status: DC | PRN

## 2023-11-11 NOTE — Progress Notes (Unsigned)
 Location:  Other ([facility]) Nursing Home Room Number: 301 A Place of Service:  SNF [phone]) Provider:  Medina-Vargas, Daric Koren, DNP, FNP-BC  Patient Care Team: Valrie Gehrig, MD as PCP - General (Family Medicine)  Extended Emergency Contact Information Primary Emergency Contact: Davlin,Rhonlee Mobile Phone: [phone] Relation: Spouse Secondary Emergency Contact: Belvin Boys Address: [address]          [place], [place] [address] Home Phone: [phone] Relation: Daughter  Code Status:   DNR  Goals of care: Advanced Directive information    08/29/2023    1:13 PM  Advanced Directives  Does Patient Have a Medical Advance Directive? Yes  Type of Advance Directive Out of facility DNR (pink MOST or yellow form)  Does patient want to make changes to medical advance directive? No - Patient declined     Chief Complaint  Patient presents with   Acute Visit    Follow up ED visit    HPI:  Pt is a 89 y.o. female seen today for medical management of chronic diseases.  ***   Past Medical History:  Diagnosis Date   Arthritis    Hypertension    Leaky heart valve    Stroke (HCC)    tia   Past Surgical History:  Procedure Laterality Date   CARDIAC CATHETERIZATION     CATARACT EXTRACTION W/PHACO Left 04/23/2016   Procedure: CATARACT EXTRACTION PHACO AND INTRAOCULAR LENS PLACEMENT (IOC);  Surgeon: Clair Crews, MD;  Location: [facility] ORS;  Service: Ophthalmology;  Laterality: Left;  US  59.9 AP% 20.9 CDE 12.48 FLUID PACK LOT # [id number]   FRACTURE SURGERY     orif shoulder   JOINT REPLACEMENT     thr    Allergies  Allergen Reactions   Doxycycline Nausea And Vomiting   Tramadol     Outpatient Encounter Medications as of 11/11/2023  Medication Sig   acetaminophen  (TYLENOL ) 500 MG tablet Take 500 mg by mouth daily as needed for moderate pain or headache.   bisacodyl  (DULCOLAX) 5 MG EC tablet Take 1 tablet (5 mg total) by mouth daily as needed for  moderate constipation.   cetirizine (ZYRTEC) 5 MG tablet Take 5 mg by mouth daily.   clopidogrel  (PLAVIX ) 75 MG tablet Take 75 mg by mouth daily.   diclofenac  Sodium (VOLTAREN ) 1 % GEL Apply 2 g topically 4 (four) times daily.   donepezil  (ARICEPT ) 5 MG tablet Take 5 mg by mouth daily.   HYDROcodone -acetaminophen  (NORCO/VICODIN) 5-325 MG tablet Take 1 tablet by mouth every 6 (six) hours as needed for severe pain (pain score 7-10).   Ketotifen Fumarate (ALAWAY OP) Apply 1 drop to eye daily as needed (dry eyes).   melatonin 1 MG TABS tablet Take 1 mg by mouth at bedtime.   polyethylene glycol (MIRALAX  / GLYCOLAX ) 17 g packet Take 17 g by mouth daily as needed for mild constipation.   pravastatin  (PRAVACHOL ) 20 MG tablet Take 20 mg by mouth at bedtime.   Zinc Oxide (TRIPLE PASTE) 12.8 % ointment Apply 1 Application topically. Every shift.   [DISCONTINUED] HYDROcodone -acetaminophen  (NORCO/VICODIN) 5-325 MG tablet Take 1 tablet by mouth every 6 (six) hours as needed for severe pain (pain score 7-10).   No facility-administered encounter medications on file as of 11/11/2023.    Review of Systems  Unable to obtain due to  being asleep with daughter at bedside.    Immunization History  Administered Date(s) Administered   Influenza-Unspecified 04/08/2018, 02/09/2020, 04/25/2021, 05/24/2022, 04/02/2023   PFIZER  Comirnaty(Gray Top)Covid-19 Tri-Sucrose Vaccine 07/01/2019, 07/22/2019, 07/24/2020   PNEUMOCOCCAL CONJUGATE-20 06/27/2023   Pneumococcal Polysaccharide-23 02/04/2013   Td 06/20/2020   Unspecified SARS-COV-2 Vaccination 03/07/2023   Pertinent  Health Maintenance Due  Topic Date Due   INFLUENZA VACCINE  01/09/2024   DEXA SCAN  Discontinued       No data to display           Vitals:   11/11/23 1658  BP: (!) 137/59  Pulse: 64  Resp: 14  Temp: 97.7 F (36.5 C)  SpO2: 99%  Weight: 107 lb 12.8 oz (48.9 kg)  Height: 5\' 2"  (1.575 m)   Body mass index is 19.72 kg/m.  Physical  Exam Constitutional:      General: She is not in acute distress.    Appearance: Normal appearance.  HENT:     Head: Normocephalic and atraumatic.     Nose: Nose normal.     Mouth/Throat:     Mouth: Mucous membranes are moist.  Eyes:     Conjunctiva/sclera: Conjunctivae normal.  Cardiovascular:     Rate and Rhythm: Normal rate and regular rhythm.  Pulmonary:     Effort: Pulmonary effort is normal.     Breath sounds: Normal breath sounds.  Abdominal:     General: Bowel sounds are normal.     Palpations: Abdomen is soft.  Musculoskeletal:     Cervical back: Normal range of motion.     Comments: Sling on LUE  Skin:    General: Skin is warm and dry.  Neurological:     Mental Status: She is alert.  Psychiatric:        Mood and Affect: Mood normal.        Behavior: Behavior normal.        Thought Content: Thought content normal.        Judgment: Judgment normal.        Labs reviewed: Recent Labs    01/27/23 1822 01/28/23 0421 01/29/23 0326 04/10/23 0000 09/04/23 0000  NA 139 140 136 138 139  K 3.9 3.9 3.9 3.9 4.3  CL 107 111 111 102 106  CO2 24 22 21* 28* 26*  GLUCOSE 168* 122* 111*  --   --   BUN 24* 23 19 14 17   CREATININE 0.90 0.76 0.73 0.8 0.7  CALCIUM 9.7 8.7* 8.1* 9.0 8.8   Recent Labs    05/07/23 0000 09/04/23 0000  AST 21 22  ALT 12 12  ALKPHOS 82 86  ALBUMIN 3.3* 3.6   Recent Labs    01/25/23 1159 01/27/23 1822 01/28/23 0421 01/29/23 0326 01/30/23 0216 01/30/23 1323 01/31/23 0424 05/07/23 0000  WBC 6.6 11.6* 9.8 8.3  --   --   --  6.0  NEUTROABS 4.8  --   --  5.8  --   --   --  3,726.00  HGB 12.2 11.7* 10.4* 9.7*   < > 10.3* 10.5* 12.2  HCT 37.1 34.4* 30.3* 27.9*   < > 30.8* 31.1* 37  MCV 96.9 94.2 92.7 92.4  --   --   --   --   PLT 172 152 140* 143*  --   --   --  278   < > = values in this interval not displayed.   No results found for: "TSH" No results found for: "HGBA1C" Lab Results  Component Value Date   CHOL 144 05/07/2023    HDL 62 05/07/2023   LDLCALC 70 05/07/2023   TRIG 50 05/07/2023  Significant Diagnostic Results in last 30 days:  DG Shoulder Left Result Date: 11/11/2023 CLINICAL DATA:  Pain after a fall. EXAM: LEFT SHOULDER - 2+ VIEW COMPARISON:  Left humerus 11/10/2023 FINDINGS: Comminuted fractures of the left humeral head and neck with lateral and posterior angulation of the distal fracture fragments. No glenohumeral dislocation. Fracture lines extend to the greater tuberosity. Visualized scapula appears intact. Old left rib fractures. IMPRESSION: Comminuted fractures of the left humeral head and neck with lateral and posterior angulation of the distal fracture fragments. Electronically Signed   By: Boyce Byes M.D.   On: 11/11/2023 00:07   DG Humerus Left Result Date: 11/11/2023 CLINICAL DATA:  Pain after a fall EXAM: LEFT HUMERUS - 2+ VIEW COMPARISON:  04/26/2013 FINDINGS: Comminuted fractures of the left humeral head and neck with posterior and lateral angulation of the distal fracture fragments. No obvious glenohumeral subluxation. Visualized scapula appears intact. Soft tissue swelling. IMPRESSION: Comminuted fractures of the left humeral head and neck with posterior and lateral angulation of the distal fragments. Electronically Signed   By: Boyce Byes M.D.   On: 11/11/2023 00:06    Assessment/Plan ***   Family/ staff Communication: Discussed plan of care with resident and charge nurse  Labs/tests ordered:     Kody Vigil Medina-Vargas, DNP, MSN, FNP-BC [facility] [phone] (Monday-Friday 8:00 a.m. - 5:00 p.m.) [phone] (after hours)

## 2023-11-11 NOTE — Discharge Instructions (Addendum)
 As we discussed, Ms. Henriquez only apparent injuries are the significant fracture to her left proximal humerus (the upper arm).  There is nothing to be done about it at this time other than immobilizing it with the sling/immobilizer in which we placed her.  She should use this as much as possible although can be removed gently and carefully for bathing.  The goal is to minimize weight bearing of the arm and to keep it immobile is much as possible.  A prescription for Norco was sent to the [facility] pharmacy on [address] to help with her pain management.  Please follow-up with her primary care provider and/or with Dr. Clyda Dark and/or one of his orthopedics colleagues at the next available opportunity for additional management recommendations.

## 2023-11-19 ENCOUNTER — Encounter: Payer: Self-pay | Admitting: Student

## 2023-11-19 ENCOUNTER — Non-Acute Institutional Stay (SKILLED_NURSING_FACILITY): Payer: Self-pay | Admitting: Student

## 2023-11-19 DIAGNOSIS — S42202S Unspecified fracture of upper end of left humerus, sequela: Secondary | ICD-10-CM | POA: Diagnosis not present

## 2023-11-19 DIAGNOSIS — S42202D Unspecified fracture of upper end of left humerus, subsequent encounter for fracture with routine healing: Secondary | ICD-10-CM

## 2023-11-19 NOTE — Progress Notes (Signed)
 Location:  Other [facility].  Nursing Home Room Number: [facility]. [employer] [address] [employer] of Service:  SNF [phone]) Provider:  Valrie Gehrig, MD  Patient Care Team: Valrie Gehrig, MD as PCP - General (Family Medicine)  Extended Emergency Contact Information Primary Emergency Contact: [facility] Phone: [phone] Relation: Spouse Secondary Emergency Contact: Belvin Boys Address: [address]          [place], [place] [address] Home Phone: [phone] Relation: Daughter  Code Status:  DNR Goals of care: Advanced Directive information    11/19/2023   12:40 PM  Advanced Directives  Does Patient Have a Medical Advance Directive? Yes  Type of Advance Directive Out of facility DNR (pink MOST or yellow form)  Does patient want to make changes to medical advance directive? No - Patient declined     Chief Complaint  Patient presents with   Pain Management    Pain Management     HPI:  Pt is a 89 y.o. female seen today for an acute visit for Pain Management  Patient is up and out of bed however is having increased pan from fracture of left humerus from 6/2 and surgery was not an option. Family previously asked for patient to be OOB to engage with community, however, based on her status of discomfort and inability to maintain upright posture, would prefer she remains more comfortable in bed.   Past Medical History:  Diagnosis Date   Arthritis    Hypertension    Leaky heart valve    Stroke (HCC)    tia   Past Surgical History:  Procedure Laterality Date   CARDIAC CATHETERIZATION     CATARACT EXTRACTION W/PHACO Left 04/23/2016   Procedure: CATARACT EXTRACTION PHACO AND INTRAOCULAR LENS PLACEMENT (IOC);  Surgeon: Clair Crews, MD;  Location: [facility] ORS;  Service: Ophthalmology;  Laterality: Left;  US  59.9 AP% 20.9 CDE 12.48 FLUID PACK LOT # [id number] H   FRACTURE SURGERY     orif shoulder   JOINT REPLACEMENT     thr    Allergies  Allergen Reactions    Doxycycline Nausea And Vomiting   Tramadol     Outpatient Encounter Medications as of 11/19/2023  Medication Sig   acetaminophen  (TYLENOL ) 500 MG tablet Take 500 mg by mouth daily as needed for moderate pain or headache.   bisacodyl  (DULCOLAX) 5 MG EC tablet Take 1 tablet (5 mg total) by mouth daily as needed for moderate constipation.   cetirizine (ZYRTEC) 5 MG tablet Take 5 mg by mouth daily.   clopidogrel  (PLAVIX ) 75 MG tablet Take 75 mg by mouth daily.   diclofenac  Sodium (VOLTAREN ) 1 % GEL Apply 2 g topically 4 (four) times daily.   donepezil  (ARICEPT ) 5 MG tablet Take 5 mg by mouth daily.   HYDROcodone -acetaminophen  (NORCO/VICODIN) 5-325 MG tablet Take 1 tablet by mouth every 6 (six) hours as needed for severe pain (pain score 7-10).   Ketotifen Fumarate (ALAWAY OP) Apply 1 drop to eye daily as needed (dry eyes).   melatonin 1 MG TABS tablet Take 1 mg by mouth at bedtime.   polyethylene glycol (MIRALAX  / GLYCOLAX ) 17 g packet Take 17 g by mouth daily as needed for mild constipation.   pravastatin  (PRAVACHOL ) 20 MG tablet Take 20 mg by mouth at bedtime.   Zinc Oxide (TRIPLE PASTE) 12.8 % ointment Apply 1 Application topically. Every shift.   No facility-administered encounter medications on file as of 11/19/2023.    Review of Systems  Immunization History  Administered  Date(s) Administered   Influenza-Unspecified 04/08/2018, 02/09/2020, 04/25/2021, 05/24/2022, 04/02/2023   PFIZER Comirnaty(Gray Top)Covid-19 Tri-Sucrose Vaccine 07/01/2019, 07/22/2019, 07/24/2020   PNEUMOCOCCAL CONJUGATE-20 06/27/2023   Pneumococcal Polysaccharide-23 02/04/2013   Td 06/20/2020   Unspecified SARS-COV-2 Vaccination 03/07/2023   Pertinent  Health Maintenance Due  Topic Date Due   INFLUENZA VACCINE  01/09/2024   DEXA SCAN  Discontinued       No data to display         Functional Status Survey:    Vitals:   11/19/23 1235  BP: (!) 140/64  Pulse: 87  Resp: 17  Temp: 97.7 F (36.5 C)   SpO2: 95%  Weight: 106 lb 9.6 oz (48.4 kg)  Height: 5' 2 (1.575 m)   Body mass index is 19.5 kg/m. Physical Exam  Cardiovascular:     Rate and Rhythm: Normal rate.     Pulses: Normal pulses.   Musculoskeletal:     Comments: LUE swelling, ttp   Neurological:     Mental Status: She is alert. She is disoriented.     Labs reviewed: Recent Labs    01/27/23 1822 01/28/23 0421 01/29/23 0326 04/10/23 0000 09/04/23 0000  NA 139 140 136 138 139  K 3.9 3.9 3.9 3.9 4.3  CL 107 111 111 102 106  CO2 24 22 21* 28* 26*  GLUCOSE 168* 122* 111*  --   --   BUN 24* 23 19 14 17   CREATININE 0.90 0.76 0.73 0.8 0.7  CALCIUM 9.7 8.7* 8.1* 9.0 8.8   Recent Labs    05/07/23 0000 09/04/23 0000  AST 21 22  ALT 12 12  ALKPHOS 82 86  ALBUMIN 3.3* 3.6   Recent Labs    01/25/23 1159 01/27/23 1822 01/28/23 0421 01/29/23 0326 01/30/23 0216 01/30/23 1323 01/31/23 0424 05/07/23 0000  WBC 6.6 11.6* 9.8 8.3  --   --   --  6.0  NEUTROABS 4.8  --   --  5.8  --   --   --  3,726.00  HGB 12.2 11.7* 10.4* 9.7*   < > 10.3* 10.5* 12.2  HCT 37.1 34.4* 30.3* 27.9*   < > 30.8* 31.1* 37  MCV 96.9 94.2 92.7 92.4  --   --   --   --   PLT 172 152 140* 143*  --   --   --  278   < > = values in this interval not displayed.   No results found for: TSH No results found for: HGBA1C Lab Results  Component Value Date   CHOL 144 05/07/2023   HDL 62 05/07/2023   LDLCALC 70 05/07/2023   TRIG 50 05/07/2023    Significant Diagnostic Results in last 30 days:  DG Shoulder Left Result Date: 11/11/2023 CLINICAL DATA:  Pain after a fall. EXAM: LEFT SHOULDER - 2+ VIEW COMPARISON:  Left humerus 11/10/2023 FINDINGS: Comminuted fractures of the left humeral head and neck with lateral and posterior angulation of the distal fracture fragments. No glenohumeral dislocation. Fracture lines extend to the greater tuberosity. Visualized scapula appears intact. Old left rib fractures. IMPRESSION: Comminuted fractures  of the left humeral head and neck with lateral and posterior angulation of the distal fracture fragments. Electronically Signed   By: Boyce Byes M.D.   On: 11/11/2023 00:07   DG Humerus Left Result Date: 11/11/2023 CLINICAL DATA:  Pain after a fall EXAM: LEFT HUMERUS - 2+ VIEW COMPARISON:  04/26/2013 FINDINGS: Comminuted fractures of the left humeral head and neck with posterior  and lateral angulation of the distal fracture fragments. No obvious glenohumeral subluxation. Visualized scapula appears intact. Soft tissue swelling. IMPRESSION: Comminuted fractures of the left humeral head and neck with posterior and lateral angulation of the distal fragments. Electronically Signed   By: Boyce Byes M.D.   On: 11/11/2023 00:06    Assessment/Plan Closed fracture of proximal end of left humerus, unspecified fracture morphology, sequela Patient with continued pain. Will get pain medication at this time. Will extend PRN for 14 days.   Family/ staff Communication: Daughter, nursing  Labs/tests ordered:  none

## 2023-11-20 MED ORDER — HYDROCODONE-ACETAMINOPHEN 5-325 MG PO TABS: 1.0000 | ORAL_TABLET | Freq: Four times a day (QID) | ORAL | 0 refills | Status: AC | PRN

## 2023-12-22 ENCOUNTER — Non-Acute Institutional Stay (SKILLED_NURSING_FACILITY): Payer: Self-pay | Admitting: Student

## 2023-12-22 ENCOUNTER — Encounter: Payer: Self-pay | Admitting: Student

## 2023-12-22 DIAGNOSIS — N1831 Chronic kidney disease, stage 3a: Secondary | ICD-10-CM

## 2023-12-22 DIAGNOSIS — S42202S Unspecified fracture of upper end of left humerus, sequela: Secondary | ICD-10-CM | POA: Diagnosis not present

## 2023-12-22 DIAGNOSIS — F03C0 Unspecified dementia, severe, without behavioral disturbance, psychotic disturbance, mood disturbance, and anxiety: Secondary | ICD-10-CM | POA: Diagnosis not present

## 2023-12-22 DIAGNOSIS — I2510 Atherosclerotic heart disease of native coronary artery without angina pectoris: Secondary | ICD-10-CM | POA: Diagnosis not present

## 2023-12-22 DIAGNOSIS — S42202D Unspecified fracture of upper end of left humerus, subsequent encounter for fracture with routine healing: Secondary | ICD-10-CM

## 2023-12-22 DIAGNOSIS — S32512K Fracture of superior rim of left pubis, subsequent encounter for fracture with nonunion: Secondary | ICD-10-CM

## 2023-12-22 DIAGNOSIS — R296 Repeated falls: Secondary | ICD-10-CM

## 2023-12-22 DIAGNOSIS — I10 Essential (primary) hypertension: Secondary | ICD-10-CM

## 2023-12-22 NOTE — Progress Notes (Signed)
 Location:  Other [facility].  Nursing Home Room Number: [facility] [address]A Place of Service:  SNF [phone]) Provider:  Abdul Fine, MD  Patient Care Team: Abdul Fine, MD as PCP - General (Family Medicine)  Extended Emergency Contact Information Primary Emergency Contact: [facility] Phone: [phone] Relation: Spouse Secondary Emergency Contact: DYANA DONNY POUR Address: [address]          [place], [place] [address] Home Phone: [phone] Relation: Daughter  Code Status:  DNR Goals of care: Advanced Directive information    11/19/2023   12:40 PM  Advanced Directives  Does Patient Have a Medical Advance Directive? Yes  Type of Advance Directive Out of facility DNR (pink MOST or yellow form)  Does patient want to make changes to medical advance directive? No - Patient declined     Chief Complaint  Patient presents with   Medical Management of Chronic Issues    Medical Management of Chronic Issues.     HPI:  Pt is a 89 y.o. female seen today for medical management of chronic diseases.  Patient with severe dementia who has had multiple falls prior to and during admission to facility. None since last routine visit. She says her leg hurts and points to her stomach.    Past Medical History:  Diagnosis Date   Arthritis    Hypertension    Leaky heart valve    Stroke (HCC)    tia   Past Surgical History:  Procedure Laterality Date   CARDIAC CATHETERIZATION     CATARACT EXTRACTION W/PHACO Left 04/23/2016   Procedure: CATARACT EXTRACTION PHACO AND INTRAOCULAR LENS PLACEMENT (IOC);  Surgeon: Elsie Carmine, MD;  Location: [facility] ORS;  Service: Ophthalmology;  Laterality: Left;  US  59.9 AP% 20.9 CDE 12.48 FLUID PACK LOT # [id number] H   FRACTURE SURGERY     orif shoulder   JOINT REPLACEMENT     thr    Allergies  Allergen Reactions   Doxycycline Nausea And Vomiting   Tramadol     Outpatient Encounter Medications as of 12/22/2023  Medication Sig    acetaminophen  (TYLENOL ) 500 MG tablet Take 500 mg by mouth daily as needed for moderate pain or headache.   bisacodyl  (DULCOLAX) 5 MG EC tablet Take 1 tablet (5 mg total) by mouth daily as needed for moderate constipation.   cetirizine (ZYRTEC) 5 MG tablet Take 5 mg by mouth daily.   clopidogrel  (PLAVIX ) 75 MG tablet Take 75 mg by mouth daily.   diclofenac  Sodium (VOLTAREN ) 1 % GEL Apply 2 g topically 4 (four) times daily.   donepezil  (ARICEPT ) 5 MG tablet Take 5 mg by mouth daily.   HYDROcodone -acetaminophen  (NORCO/VICODIN) 5-325 MG tablet Take 1 tablet by mouth every 6 (six) hours as needed for moderate pain (pain score 4-6).   Ketotifen Fumarate (ALAWAY OP) Apply 1 drop to eye daily as needed (dry eyes).   melatonin 1 MG TABS tablet Take 1 mg by mouth at bedtime.   polyethylene glycol (MIRALAX  / GLYCOLAX ) 17 g packet Take 17 g by mouth daily as needed for mild constipation.   pravastatin  (PRAVACHOL ) 20 MG tablet Take 20 mg by mouth at bedtime.   Zinc Oxide (TRIPLE PASTE) 12.8 % ointment Apply 1 Application topically. Every shift.   No facility-administered encounter medications on file as of 12/22/2023.    Review of Systems  Immunization History  Administered Date(s) Administered   Influenza-Unspecified 04/08/2018, 02/09/2020, 04/25/2021, 05/24/2022, 04/02/2023   PFIZER Comirnaty(Gray Top)Covid-19 Tri-Sucrose Vaccine 07/01/2019, 07/22/2019, 07/24/2020  PNEUMOCOCCAL CONJUGATE-20 06/27/2023   Pneumococcal Polysaccharide-23 02/04/2013   Td 06/20/2020   Unspecified SARS-COV-2 Vaccination 03/07/2023   Pertinent  Health Maintenance Due  Topic Date Due   INFLUENZA VACCINE  01/09/2024   DEXA SCAN  Discontinued       No data to display         Functional Status Survey:    Vitals:   12/22/23 1205  BP: 111/60  Pulse: 72  Resp: 20  Temp: (!) 97.5 F (36.4 C)  SpO2: 97%  Weight: 107 lb 3.2 oz (48.6 kg)  Height: 5' 2 (1.575 m)   Body mass index is 19.61 kg/m. Physical  Exam Cardiovascular:     Rate and Rhythm: Normal rate.  Pulmonary:     Effort: Pulmonary effort is normal.  Skin:    General: Skin is warm and dry.  Neurological:     Mental Status: She is alert. She is disoriented.     Labs reviewed: Recent Labs    01/27/23 1822 01/28/23 0421 01/29/23 0326 04/10/23 0000 09/04/23 0000  NA 139 140 136 138 139  K 3.9 3.9 3.9 3.9 4.3  CL 107 111 111 102 106  CO2 24 22 21* 28* 26*  GLUCOSE 168* 122* 111*  --   --   BUN 24* 23 19 14 17   CREATININE 0.90 0.76 0.73 0.8 0.7  CALCIUM 9.7 8.7* 8.1* 9.0 8.8   Recent Labs    05/07/23 0000 09/04/23 0000  AST 21 22  ALT 12 12  ALKPHOS 82 86  ALBUMIN 3.3* 3.6   Recent Labs    01/25/23 1159 01/27/23 1822 01/28/23 0421 01/29/23 0326 01/30/23 0216 01/30/23 1323 01/31/23 0424 05/07/23 0000  WBC 6.6 11.6* 9.8 8.3  --   --   --  6.0  NEUTROABS 4.8  --   --  5.8  --   --   --  3,726.00  HGB 12.2 11.7* 10.4* 9.7*   < > 10.3* 10.5* 12.2  HCT 37.1 34.4* 30.3* 27.9*   < > 30.8* 31.1* 37  MCV 96.9 94.2 92.7 92.4  --   --   --   --   PLT 172 152 140* 143*  --   --   --  278   < > = values in this interval not displayed.   No results found for: TSH No results found for: HGBA1C Lab Results  Component Value Date   CHOL 144 05/07/2023   HDL 62 05/07/2023   LDLCALC 70 05/07/2023   TRIG 50 05/07/2023    Significant Diagnostic Results in last 30 days:  No results found.  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 Periodically with poor PO intake. Weight stable, however, BMI borderline at 19.61 for malnourishment. She continues protein supplementation. BP well-controlled at this time. She has been released by orthopedic surgery for humerus and pelvic fracture. Kidney function stable. Continue supportive care. Continue goals of care conversatiosn with family regarding interventions.   Family/ staff Communication: nursing  Labs/tests ordered:  none

## 2023-12-28 ENCOUNTER — Encounter: Payer: Self-pay | Admitting: Student

## 2024-02-12 ENCOUNTER — Encounter: Payer: Self-pay | Admitting: Nurse Practitioner

## 2024-02-12 ENCOUNTER — Non-Acute Institutional Stay (SKILLED_NURSING_FACILITY): Admitting: Nurse Practitioner

## 2024-02-12 DIAGNOSIS — R296 Repeated falls: Secondary | ICD-10-CM

## 2024-02-12 DIAGNOSIS — F03C0 Unspecified dementia, severe, without behavioral disturbance, psychotic disturbance, mood disturbance, and anxiety: Secondary | ICD-10-CM

## 2024-02-12 DIAGNOSIS — I10 Essential (primary) hypertension: Secondary | ICD-10-CM | POA: Diagnosis not present

## 2024-02-12 DIAGNOSIS — E785 Hyperlipidemia, unspecified: Secondary | ICD-10-CM

## 2024-02-12 DIAGNOSIS — M150 Primary generalized (osteo)arthritis: Secondary | ICD-10-CM

## 2024-02-12 DIAGNOSIS — I2510 Atherosclerotic heart disease of native coronary artery without angina pectoris: Secondary | ICD-10-CM | POA: Diagnosis not present

## 2024-02-12 NOTE — Progress Notes (Signed)
 Location:  [facility].  Nursing Home Room Number: [facility] [id number] Place of Service:  SNF [phone]) Harlene An, NP  PCP: Abdul Fine, MD  Patient Care Team: Abdul Fine, MD as PCP - General [facility] Medicine)  Extended Emergency Contact Information Primary Emergency Contact: [facility] Phone: [phone] Relation: Spouse Secondary Emergency Contact: DYANA DONNY POUR Address: [address]          [place], [place] [address] Home Phone: [phone] Relation: Daughter  Goals of care: Advanced Directive information    02/12/2024   10:08 AM  Advanced Directives  Does Patient Have a Medical Advance Directive? Yes  Type of Advance Directive Out of facility DNR (pink MOST or yellow form)  Does patient want to make changes to medical advance directive? No - Patient declined     Chief Complaint  Patient presents with   Medical Management of Chronic Issues    Medical Management of Chronic Issues.     HPI:  Pt is a 89 y.o. female seen today for medical management of chronic disease. Pt with hx of dementia, frequent falls, htn, CVA High fall risk and last fall fell and fractured left humerus. She is doing well at this time. No complaints of pain.   She is off all blood pressure medication at this time.  Continues on plavix  due to hx of CVA. No signs of abnormal bruising or bleeding Staff has no acute concerns.   Past Medical History:  Diagnosis Date   Arthritis    Hypertension    Leaky heart valve    Stroke (HCC)    tia   Past Surgical History:  Procedure Laterality Date   CARDIAC CATHETERIZATION     CATARACT EXTRACTION W/PHACO Left 04/23/2016   Procedure: CATARACT EXTRACTION PHACO AND INTRAOCULAR LENS PLACEMENT (IOC);  Surgeon: Elsie Carmine, MD;  Location: [facility] ORS;  Service: Ophthalmology;  Laterality: Left;  US  59.9 AP% 20.9 CDE 12.48 FLUID PACK LOT # [id number]   FRACTURE SURGERY     orif shoulder   JOINT REPLACEMENT     thr     Allergies  Allergen Reactions   Doxycycline Nausea And Vomiting   Tramadol     Outpatient Encounter Medications as of 02/12/2024  Medication Sig   acetaminophen  (TYLENOL ) 500 MG tablet Take 500 mg by mouth daily as needed for moderate pain or headache.   bisacodyl  (DULCOLAX) 5 MG EC tablet Take 1 tablet (5 mg total) by mouth daily as needed for moderate constipation.   cetirizine (ZYRTEC) 5 MG tablet Take 5 mg by mouth daily.   clopidogrel  (PLAVIX ) 75 MG tablet Take 75 mg by mouth daily.   diclofenac  Sodium (VOLTAREN ) 1 % GEL Apply 2 g topically 4 (four) times daily.   donepezil  (ARICEPT ) 5 MG tablet Take 5 mg by mouth daily.   Ketotifen Fumarate (ALAWAY OP) Apply 1 drop to eye daily as needed (dry eyes).   melatonin 1 MG TABS tablet Take 1 mg by mouth at bedtime.   polyethylene glycol (MIRALAX  / GLYCOLAX ) 17 g packet Take 17 g by mouth daily as needed for mild constipation.   pravastatin  (PRAVACHOL ) 20 MG tablet Take 20 mg by mouth at bedtime.   Zinc Oxide (TRIPLE PASTE) 12.8 % ointment Apply 1 Application topically. Every shift.   HYDROcodone -acetaminophen  (NORCO/VICODIN) 5-325 MG tablet Take 1 tablet by mouth every 6 (six) hours as needed for moderate pain (pain score 4-6). (Patient not taking: Reported on 02/12/2024)   No facility-administered encounter medications on file as  of 02/12/2024.    Review of Systems  Unable to perform ROS: Dementia     Immunization History  Administered Date(s) Administered   Influenza-Unspecified 04/08/2018, 02/09/2020, 04/25/2021, 05/24/2022, 04/02/2023   PFIZER Comirnaty(Gray Top)Covid-19 Tri-Sucrose Vaccine 07/01/2019, 07/22/2019, 07/24/2020   PNEUMOCOCCAL CONJUGATE-20 06/27/2023   Pneumococcal Polysaccharide-23 02/04/2013   Td 06/20/2020   Unspecified SARS-COV-2 Vaccination 03/07/2023   Pertinent  Health Maintenance Due  Topic Date Due   INFLUENZA VACCINE  01/09/2024   DEXA SCAN  Discontinued       No data to display          Functional Status Survey:    Vitals:   02/12/24 1002 02/12/24 1026  BP: (!) 165/78 (!) 140/66  Pulse: 72   Resp: 17   Temp: 97.9 F (36.6 C)   SpO2: 99%   Weight: 103 lb 6.4 oz (46.9 kg)   Height: 5' 2 (1.575 m)    Body mass index is 18.91 kg/m. Physical Exam Constitutional:      General: She is not in acute distress.    Appearance: She is well-developed. She is not diaphoretic.  HENT:     Head: Normocephalic and atraumatic.     Mouth/Throat:     Pharynx: No oropharyngeal exudate.  Eyes:     Conjunctiva/sclera: Conjunctivae normal.     Pupils: Pupils are equal, round, and reactive to light.  Cardiovascular:     Rate and Rhythm: Normal rate and regular rhythm.     Heart sounds: Normal heart sounds.  Pulmonary:     Effort: Pulmonary effort is normal.     Breath sounds: Normal breath sounds.  Abdominal:     General: Bowel sounds are normal.     Palpations: Abdomen is soft.  Musculoskeletal:     Cervical back: Normal range of motion and neck supple.     Right lower leg: No edema.     Left lower leg: No edema.  Skin:    General: Skin is warm and dry.  Neurological:     Mental Status: She is alert. Mental status is at baseline.     Motor: Weakness present.     Gait: Gait abnormal.  Psychiatric:        Mood and Affect: Mood normal.     Labs reviewed: Recent Labs    04/10/23 0000 09/04/23 0000  NA 138 139  K 3.9 4.3  CL 102 106  CO2 28* 26*  BUN 14 17  CREATININE 0.8 0.7  CALCIUM 9.0 8.8   Recent Labs    05/07/23 0000 09/04/23 0000  AST 21 22  ALT 12 12  ALKPHOS 82 86  ALBUMIN 3.3* 3.6   Recent Labs    05/07/23 0000  WBC 6.0  NEUTROABS 3,726.00  HGB 12.2  HCT 37  PLT 278   No results found for: TSH No results found for: HGBA1C Lab Results  Component Value Date   CHOL 144 05/07/2023   HDL 62 05/07/2023   LDLCALC 70 05/07/2023   TRIG 50 05/07/2023    Significant Diagnostic Results in last 30 days:  No results  found.  Assessment/Plan Benign essential hypertension Blood pressure controlled Remains off medication.  follow metabolic panel   CAD (coronary artery disease), native coronary artery Stable as this time, no chest pains.   Frequent falls High fall risk with fall precaution in facility, continue to monitor.  Hyperlipidemia Continues on pravachol . LDL at goal on last labs.  Monitor yearly   Primary osteoarthritis involving multiple  joints Stable without pain, continues on voltaren  gel and tylenol    Severe dementia without behavioral disturbance, psychotic disturbance, mood disturbance, or anxiety (HCC) Stable, no acute changes in cognitive or functional status, continue supportive care.      Davis Vannatter K. Caro BODILY [facility] [phone]

## 2024-02-12 NOTE — Assessment & Plan Note (Signed)
 Blood pressure controlled Remains off medication.  follow metabolic panel

## 2024-02-12 NOTE — Assessment & Plan Note (Signed)
 Stable without pain, continues on voltaren  gel and tylenol 

## 2024-02-12 NOTE — Assessment & Plan Note (Signed)
 High fall risk with fall precaution in facility, continue to monitor.

## 2024-02-12 NOTE — Assessment & Plan Note (Signed)
 Stable as this time, no chest pains.

## 2024-02-12 NOTE — Assessment & Plan Note (Signed)
 Stable, no acute changes in cognitive or functional status, continue supportive care.

## 2024-02-12 NOTE — Assessment & Plan Note (Signed)
 Continues on pravachol . LDL at goal on last labs.  Monitor yearly

## 2024-04-13 ENCOUNTER — Non-Acute Institutional Stay (SKILLED_NURSING_FACILITY): Payer: Self-pay | Admitting: Nurse Practitioner

## 2024-04-13 ENCOUNTER — Encounter: Payer: Self-pay | Admitting: Nurse Practitioner

## 2024-04-13 DIAGNOSIS — I38 Endocarditis, valve unspecified: Secondary | ICD-10-CM

## 2024-04-13 DIAGNOSIS — F03C0 Unspecified dementia, severe, without behavioral disturbance, psychotic disturbance, mood disturbance, and anxiety: Secondary | ICD-10-CM | POA: Diagnosis not present

## 2024-04-13 DIAGNOSIS — E785 Hyperlipidemia, unspecified: Secondary | ICD-10-CM | POA: Diagnosis not present

## 2024-04-13 DIAGNOSIS — M150 Primary generalized (osteo)arthritis: Secondary | ICD-10-CM

## 2024-04-13 DIAGNOSIS — K5900 Constipation, unspecified: Secondary | ICD-10-CM

## 2024-04-13 DIAGNOSIS — I10 Essential (primary) hypertension: Secondary | ICD-10-CM

## 2024-04-13 DIAGNOSIS — I2510 Atherosclerotic heart disease of native coronary artery without angina pectoris: Secondary | ICD-10-CM

## 2024-04-13 NOTE — Progress Notes (Unsigned)
 Location:   [facility].   Place of Service:   [facility] [place] [address] Harlene An, NP  PCP: Laurence Locus, DO  Patient Care Team: Laurence Locus, DO as PCP - General (Internal Medicine)  Extended Emergency Contact Information Primary Emergency Contact: [facility] Phone: [phone] Relation: Spouse Secondary Emergency Contact: DYANA DONNY POUR Address: [address]          [place], [place] [address] Home Phone: [phone] Relation: Daughter  Goals of care: Advanced Directive information    04/13/2024   11:38 AM  Advanced Directives  Does Patient Have a Medical Advance Directive? Yes  Type of Advance Directive Out of facility DNR (pink MOST or yellow form)  Does patient want to make changes to medical advance directive? No - Patient declined     Chief Complaint  Patient presents with   Discharge Note    Discharge with Hospice.     HPI:  Pt is a 89 y.o. female seen today for Discharge with Hospice.    Past Medical History:  Diagnosis Date   Arthritis    Hypertension    Leaky heart valve    Stroke (HCC)    tia   Past Surgical History:  Procedure Laterality Date   CARDIAC CATHETERIZATION     CATARACT EXTRACTION W/PHACO Left 04/23/2016   Procedure: CATARACT EXTRACTION PHACO AND INTRAOCULAR LENS PLACEMENT (IOC);  Surgeon: Elsie Carmine, MD;  Location: [facility] ORS;  Service: Ophthalmology;  Laterality: Left;  US  59.9 AP% 20.9 CDE 12.48 FLUID PACK LOT # [id number] H   FRACTURE SURGERY     orif shoulder   JOINT REPLACEMENT     thr    Allergies  Allergen Reactions   Doxycycline Nausea And Vomiting   Tramadol     Outpatient Encounter Medications as of 04/13/2024  Medication Sig   acetaminophen  (TYLENOL ) 500 MG tablet Take 500 mg by mouth daily as needed for moderate pain or headache.   bisacodyl  (DULCOLAX) 5 MG EC tablet Take 1 tablet (5 mg total) by mouth daily as needed for moderate constipation.   cetirizine (ZYRTEC) 5 MG tablet Take 5 mg by  mouth daily.   clopidogrel  (PLAVIX ) 75 MG tablet Take 75 mg by mouth daily.   diclofenac  Sodium (VOLTAREN ) 1 % GEL Apply 2 g topically 4 (four) times daily.   donepezil  (ARICEPT ) 5 MG tablet Take 5 mg by mouth daily.   Ketotifen Fumarate (ALAWAY OP) Apply 1 drop to eye daily as needed (dry eyes).   melatonin 1 MG TABS tablet Take 1 mg by mouth at bedtime.   polyethylene glycol (MIRALAX  / GLYCOLAX ) 17 g packet Take 17 g by mouth daily as needed for mild constipation.   pravastatin  (PRAVACHOL ) 20 MG tablet Take 20 mg by mouth at bedtime.   sennosides-docusate sodium  (SENOKOT-S) 8.6-50 MG tablet Take 1 tablet by mouth at bedtime.   Zinc Oxide (TRIPLE PASTE) 12.8 % ointment Apply 1 Application topically. Every shift.   No facility-administered encounter medications on file as of 04/13/2024.    Review of Systems ***  Immunization History  Administered Date(s) Administered   Influenza-Unspecified 04/08/2018, 02/09/2020, 04/25/2021, 05/24/2022, 04/02/2023, 03/23/2024   PFIZER Comirnaty(Gray Top)Covid-19 Tri-Sucrose Vaccine 07/01/2019, 07/22/2019, 07/24/2020   PNEUMOCOCCAL CONJUGATE-20 06/27/2023   Pneumococcal Polysaccharide-23 02/04/2013   Td 06/20/2020   Unspecified SARS-COV-2 Vaccination 03/07/2023   Pertinent  Health Maintenance Due  Topic Date Due   Influenza Vaccine  Completed   DEXA SCAN  Discontinued       No data  to display         Functional Status Survey:    Vitals:   04/13/24 1133 04/13/24 1148  BP: (!) 150/63 108/68  Pulse: 66   Resp: 15   Temp: 97.9 F (36.6 C)   SpO2: 96%   Weight: 104 lb (47.2 kg)   Height: 5' 2 (1.575 m)    Body mass index is 19.02 kg/m. Physical Exam***  Labs reviewed: Recent Labs    09/04/23 0000  NA 139  K 4.3  CL 106  CO2 26*  BUN 17  CREATININE 0.7  CALCIUM 8.8   Recent Labs    05/07/23 0000 09/04/23 0000  AST 21 22  ALT 12 12  ALKPHOS 82 86  ALBUMIN 3.3* 3.6   Recent Labs    05/07/23 0000  WBC 6.0   NEUTROABS 3,726.00  HGB 12.2  HCT 37  PLT 278   No results found for: TSH No results found for: HGBA1C Lab Results  Component Value Date   CHOL 144 05/07/2023   HDL 62 05/07/2023   LDLCALC 70 05/07/2023   TRIG 50 05/07/2023    Significant Diagnostic Results in last 30 days:  No results found.  Assessment/Plan No problem-specific Assessment & Plan notes found for this encounter.     Jessica K. Caro BODILY [facility] [phone]

## 2024-04-13 NOTE — Progress Notes (Unsigned)
 Location:   [facility].   Place of Service:   [place] [address] Harlene An, NP  PCP: Laurence Locus, DO  Patient Care Team: Laurence Locus, DO as PCP - General (Internal Medicine)  Extended Emergency Contact Information Primary Emergency Contact: [facility] Phone: [phone] Relation: Spouse Secondary Emergency Contact: DYANA DONNY POUR Address: [address]          [place], [place] [address] Home Phone: [phone] Relation: Daughter  Goals of care: Advanced Directive information    04/13/2024   11:38 AM  Advanced Directives  Does Patient Have a Medical Advance Directive? Yes  Type of Advance Directive Out of facility DNR (pink MOST or yellow form)  Does patient want to make changes to medical advance directive? No - Patient declined   Chief Complaint  Patient presents with   Discharge Note    Discharge with Hospice.    HPI: The patient is a 89 year old female seen today for discharge planning with transition to hospice care. Her medical history includes dementia, osteoarthritis, hypertension, and hyperlipidemia.   She is being discharged home with 24-hour care provided by family and private aides. A 30-day supply of medications will be sent to [facility] in [place], [place]. Her daughter is present at the bedside to answer questions and states family will assist with transportation home.   Hospice provider is scheduled to evaluate the patient on the day of discharge.   The patient is alert, pleasantly confused, and cooperative with care. She is able to recognize herself and her family. Per nursing staff, she eats and drinks occasionally with assistance, has no swallowing difficulties, and takes medications crushed with applesauce.   Bowel and bladder function are reported as normal. There are no current nursing concerns. Although she is not yet enrolled in hospice, she will be upon arrival home. Pain is assessed through facial expressions.   She uses a  wheelchair and is able to stand and pivot for transfers. At this time, her daughter has declined any need for additional services or support.  Past Medical History:  Diagnosis Date   Arthritis    Hypertension    Leaky heart valve    Stroke (HCC)    tia   Past Surgical History:  Procedure Laterality Date   CARDIAC CATHETERIZATION     CATARACT EXTRACTION W/PHACO Left 04/23/2016   Procedure: CATARACT EXTRACTION PHACO AND INTRAOCULAR LENS PLACEMENT (IOC);  Surgeon: Elsie Carmine, MD;  Location: [facility] ORS;  Service: Ophthalmology;  Laterality: Left;  US  59.9 AP% 20.9 CDE 12.48 FLUID PACK LOT # [id number] H   FRACTURE SURGERY     orif shoulder   JOINT REPLACEMENT     thr   Allergies  Allergen Reactions   Doxycycline Nausea And Vomiting   Tramadol    Outpatient Encounter Medications as of 04/13/2024  Medication Sig   acetaminophen  (TYLENOL ) 500 MG tablet Take 500 mg by mouth daily as needed for moderate pain or headache.   bisacodyl  (DULCOLAX) 5 MG EC tablet Take 1 tablet (5 mg total) by mouth daily as needed for moderate constipation.   cetirizine (ZYRTEC) 5 MG tablet Take 5 mg by mouth daily.   clopidogrel  (PLAVIX ) 75 MG tablet Take 75 mg by mouth daily.   diclofenac  Sodium (VOLTAREN ) 1 % GEL Apply 2 g topically 4 (four) times daily.   donepezil  (ARICEPT ) 5 MG tablet Take 5 mg by mouth daily.   Ketotifen Fumarate (ALAWAY OP) Apply 1 drop to eye daily as needed (dry  eyes).   melatonin 1 MG TABS tablet Take 1 mg by mouth at bedtime.   polyethylene glycol (MIRALAX  / GLYCOLAX ) 17 g packet Take 17 g by mouth daily as needed for mild constipation.   pravastatin  (PRAVACHOL ) 20 MG tablet Take 20 mg by mouth at bedtime.   sennosides-docusate sodium  (SENOKOT-S) 8.6-50 MG tablet Take 1 tablet by mouth at bedtime.   Zinc Oxide (TRIPLE PASTE) 12.8 % ointment Apply 1 Application topically. Every shift.   No facility-administered encounter medications on file as of 04/13/2024.   Review of  Systems  Unable to perform ROS: Dementia    Immunization History  Administered Date(s) Administered   Influenza-Unspecified 04/08/2018, 02/09/2020, 04/25/2021, 05/24/2022, 04/02/2023, 03/23/2024   PFIZER Comirnaty(Gray Top)Covid-19 Tri-Sucrose Vaccine 07/01/2019, 07/22/2019, 07/24/2020   PNEUMOCOCCAL CONJUGATE-20 06/27/2023   Pneumococcal Polysaccharide-23 02/04/2013   Td 06/20/2020   Unspecified SARS-COV-2 Vaccination 03/07/2023   Pertinent  Health Maintenance Due  Topic Date Due   Influenza Vaccine  Completed   DEXA SCAN  Discontinued       No data to display         Functional Status Survey:    Vitals:   04/13/24 1133 04/13/24 1148  BP: (!) 150/63 108/68  Pulse: 66   Resp: 15   Temp: 97.9 F (36.6 C)   SpO2: 96%   Weight: 104 lb (47.2 kg)   Height: 5' 2 (1.575 m)    Body mass index is 19.02 kg/m. Physical Exam Vitals reviewed.  HENT:     Head: Normocephalic and atraumatic.     Mouth/Throat:     Mouth: Mucous membranes are dry.     Pharynx: Oropharynx is clear.  Eyes:     Conjunctiva/sclera: Conjunctivae normal.  Cardiovascular:     Rate and Rhythm: Normal rate and regular rhythm.     Pulses: Normal pulses.     Heart sounds: Normal heart sounds.  Pulmonary:     Effort: Pulmonary effort is normal.     Breath sounds: Normal breath sounds.  Abdominal:     General: Bowel sounds are normal.     Palpations: Abdomen is soft.  Musculoskeletal:     Right lower leg: No edema.     Left lower leg: No edema.  Skin:    General: Skin is warm and dry.  Neurological:     Mental Status: She is alert. She is disoriented.     Motor: Weakness present.  Psychiatric:        Cognition and Memory: Cognition is impaired.    Labs reviewed: Recent Labs    09/04/23 0000  NA 139  K 4.3  CL 106  CO2 26*  BUN 17  CREATININE 0.7  CALCIUM 8.8   Recent Labs    05/07/23 0000 09/04/23 0000  AST 21 22  ALT 12 12  ALKPHOS 82 86  ALBUMIN 3.3* 3.6   Recent Labs     05/07/23 0000  WBC 6.0  NEUTROABS 3,726.00  HGB 12.2  HCT 37  PLT 278   No results found for: TSH No results found for: HGBA1C Lab Results  Component Value Date   CHOL 144 05/07/2023   HDL 62 05/07/2023   LDLCALC 70 05/07/2023   TRIG 50 05/07/2023   Significant Diagnostic Results in last 30 days:  No results found.  Assessment/Plan  1.Severe Dementia without Behavioral, Psychotic, Mood, or Anxiety Disturbance (Unspecified Type) - HCC (Primary)  - Current baseline; remains cognitively impaired - Continue Aricept  as prescribed - Transitioning home  with hospice care  2 Valvular Heart Disease  - Chronic and stable; no acute concerns - Continue Plavix  as prescribed  3. Hyperlipidemia (Unspecified Type)  - Chronic and stable - Continue Pravastatin  as prescribed  4. Constipation (Unspecified Type)  - Stable; no acute concerns - Continue Dulcolax and Senokot as prescribed  5. Primary Osteoarthritis Involving Multiple Joints  - Chronic and stable - Continue Voltaren  gel and Tylenol  as prescribed for pain relief  6. Benign Essential Hypertension  - Stable blood pressure - Currently not on antihypertensive medications  A 30-day supply of all [facility] [place], [place].   Waylan Rabon, AGPCNP Student - [facility]  Harlene POUR. Caro BODILY [facility] [phone]

## 2024-04-14 MED ORDER — DICLOFENAC SODIUM 1 % EX GEL: 2.0000 g | Freq: Four times a day (QID) | CUTANEOUS | 0 refills | Status: DC

## 2024-04-14 MED ORDER — DONEPEZIL HCL 5 MG PO TABS: 5.0000 mg | ORAL_TABLET | Freq: Every day | ORAL | 0 refills | Status: DC

## 2024-04-14 MED ORDER — CLOPIDOGREL BISULFATE 75 MG PO TABS: 75.0000 mg | ORAL_TABLET | Freq: Every day | ORAL | 0 refills | Status: DC

## 2024-04-14 MED ORDER — PRAVASTATIN SODIUM 20 MG PO TABS: 20.0000 mg | ORAL_TABLET | Freq: Every day | ORAL | 0 refills | Status: DC

## 2024-06-10 DEATH — deceased
